# Patient Record
Sex: Female | Born: 1940 | Race: White | Hispanic: No | Smoking: Former smoker
Health system: Southern US, Community
[De-identification: ages and names within clinical notes are randomized; demographics above are authoritative.]

## PROBLEM LIST (undated history)

## (undated) DIAGNOSIS — C801 Malignant (primary) neoplasm, unspecified: Secondary | ICD-10-CM

## (undated) DIAGNOSIS — Z923 Personal history of irradiation: Secondary | ICD-10-CM

## (undated) DIAGNOSIS — D490 Neoplasm of unspecified behavior of digestive system: Secondary | ICD-10-CM

## (undated) DIAGNOSIS — C50919 Malignant neoplasm of unspecified site of unspecified female breast: Secondary | ICD-10-CM

## (undated) HISTORY — PX: APPENDECTOMY: SHX54

## (undated) HISTORY — PX: SPLENECTOMY, TOTAL: SHX788

## (undated) HISTORY — PX: BREAST LUMPECTOMY: SHX2

---

## 2021-06-08 ENCOUNTER — Encounter (HOSPITAL_COMMUNITY): Payer: Self-pay

## 2021-06-08 ENCOUNTER — Other Ambulatory Visit: Payer: Self-pay

## 2021-06-08 ENCOUNTER — Emergency Department (HOSPITAL_COMMUNITY)
Admission: EM | Admit: 2021-06-08 | Discharge: 2021-06-08 | Disposition: A | Discharge status: Home / Self Care | Payer: Medicare Other | Attending: Emergency Medicine | Admitting: Emergency Medicine

## 2021-06-08 ENCOUNTER — Emergency Department (HOSPITAL_COMMUNITY): Payer: Medicare Other

## 2021-06-08 DIAGNOSIS — Z7982 Long term (current) use of aspirin: Secondary | ICD-10-CM | POA: Insufficient documentation

## 2021-06-08 DIAGNOSIS — Z743 Need for continuous supervision: Secondary | ICD-10-CM | POA: Diagnosis not present

## 2021-06-08 DIAGNOSIS — I6521 Occlusion and stenosis of right carotid artery: Secondary | ICD-10-CM | POA: Insufficient documentation

## 2021-06-08 DIAGNOSIS — Z79899 Other long term (current) drug therapy: Secondary | ICD-10-CM | POA: Insufficient documentation

## 2021-06-08 DIAGNOSIS — G5632 Lesion of radial nerve, left upper limb: Secondary | ICD-10-CM | POA: Diagnosis not present

## 2021-06-08 DIAGNOSIS — Z20822 Contact with and (suspected) exposure to covid-19: Secondary | ICD-10-CM | POA: Insufficient documentation

## 2021-06-08 DIAGNOSIS — R531 Weakness: Secondary | ICD-10-CM | POA: Diagnosis not present

## 2021-06-08 DIAGNOSIS — Z853 Personal history of malignant neoplasm of breast: Secondary | ICD-10-CM | POA: Diagnosis not present

## 2021-06-08 DIAGNOSIS — R29818 Other symptoms and signs involving the nervous system: Secondary | ICD-10-CM | POA: Diagnosis not present

## 2021-06-08 DIAGNOSIS — R6889 Other general symptoms and signs: Secondary | ICD-10-CM | POA: Diagnosis not present

## 2021-06-08 HISTORY — DX: Neoplasm of unspecified behavior of digestive system: D49.0

## 2021-06-08 HISTORY — DX: Malignant (primary) neoplasm, unspecified: C80.1

## 2021-06-08 LAB — CBC
HCT: 38.8 % (ref 36.0–46.0)
Hemoglobin: 13.2 g/dL (ref 12.0–15.0)
MCH: 32.5 pg (ref 26.0–34.0)
MCHC: 34 g/dL (ref 30.0–36.0)
MCV: 95.6 fL (ref 80.0–100.0)
Platelets: 300 10*3/uL (ref 150–400)
RBC: 4.06 MIL/uL (ref 3.87–5.11)
RDW: 14.5 % (ref 11.5–15.5)
WBC: 8.2 10*3/uL (ref 4.0–10.5)
nRBC: 0 % (ref 0.0–0.2)

## 2021-06-08 LAB — COMPREHENSIVE METABOLIC PANEL
ALT: 21 U/L (ref 0–44)
AST: 26 U/L (ref 15–41)
Albumin: 3.6 g/dL (ref 3.5–5.0)
Alkaline Phosphatase: 64 U/L (ref 38–126)
Anion gap: 8 (ref 5–15)
BUN: 20 mg/dL (ref 8–23)
CO2: 23 mmol/L (ref 22–32)
Calcium: 9.1 mg/dL (ref 8.9–10.3)
Chloride: 108 mmol/L (ref 98–111)
Creatinine, Ser: 1.14 mg/dL — ABNORMAL HIGH (ref 0.44–1.00)
GFR, Estimated: 48 mL/min — ABNORMAL LOW (ref >60)
Glucose, Bld: 151 mg/dL — ABNORMAL HIGH (ref 70–99)
Potassium: 3.9 mmol/L (ref 3.5–5.1)
Sodium: 139 mmol/L (ref 135–145)
Total Bilirubin: 0.3 mg/dL (ref 0.3–1.2)
Total Protein: 6.4 g/dL — ABNORMAL LOW (ref 6.5–8.1)

## 2021-06-08 LAB — URINALYSIS, ROUTINE W REFLEX MICROSCOPIC
Bilirubin Urine: NEGATIVE
Glucose, UA: NEGATIVE mg/dL
Hgb urine dipstick: NEGATIVE
Ketones, ur: NEGATIVE mg/dL
Leukocytes,Ua: NEGATIVE
Nitrite: NEGATIVE
Protein, ur: NEGATIVE mg/dL
Specific Gravity, Urine: 1.009 (ref 1.005–1.030)
pH: 7 (ref 5.0–8.0)

## 2021-06-08 LAB — DIFFERENTIAL
Abs Immature Granulocytes: 0.01 10*3/uL (ref 0.00–0.07)
Basophils Absolute: 0.1 10*3/uL (ref 0.0–0.1)
Basophils Relative: 1 %
Eosinophils Absolute: 0.5 10*3/uL (ref 0.0–0.5)
Eosinophils Relative: 6 %
Immature Granulocytes: 0 %
Lymphocytes Relative: 35 %
Lymphs Abs: 2.8 10*3/uL (ref 0.7–4.0)
Monocytes Absolute: 1.1 10*3/uL — ABNORMAL HIGH (ref 0.1–1.0)
Monocytes Relative: 14 %
Neutro Abs: 3.6 10*3/uL (ref 1.7–7.7)
Neutrophils Relative %: 44 %

## 2021-06-08 LAB — I-STAT CHEM 8, ED
BUN: 23 mg/dL (ref 8–23)
Calcium, Ion: 1.1 mmol/L — ABNORMAL LOW (ref 1.15–1.40)
Chloride: 108 mmol/L (ref 98–111)
Creatinine, Ser: 1.1 mg/dL — ABNORMAL HIGH (ref 0.44–1.00)
Glucose, Bld: 116 mg/dL — ABNORMAL HIGH (ref 70–99)
HCT: 37 % (ref 36.0–46.0)
Hemoglobin: 12.6 g/dL (ref 12.0–15.0)
Potassium: 3.8 mmol/L (ref 3.5–5.1)
Sodium: 140 mmol/L (ref 135–145)
TCO2: 22 mmol/L (ref 22–32)

## 2021-06-08 LAB — APTT: aPTT: 28 seconds (ref 24–36)

## 2021-06-08 LAB — RAPID URINE DRUG SCREEN, HOSP PERFORMED
Amphetamines: NOT DETECTED
Barbiturates: NOT DETECTED
Benzodiazepines: NOT DETECTED
Cocaine: NOT DETECTED
Opiates: NOT DETECTED
Tetrahydrocannabinol: NOT DETECTED

## 2021-06-08 LAB — RESP PANEL BY RT-PCR (FLU A&B, COVID) ARPGX2
Influenza A by PCR: NEGATIVE
Influenza B by PCR: NEGATIVE
SARS Coronavirus 2 by RT PCR: NEGATIVE

## 2021-06-08 LAB — PROTIME-INR
INR: 0.9 (ref 0.8–1.2)
Prothrombin Time: 12.5 seconds (ref 11.4–15.2)

## 2021-06-08 IMAGING — MR MR HEAD W/O CM
9 of 10 series · 38 of 48 positions shown · non-contrast
Comparison: None.

CLINICAL DATA: Neuro deficit, stroke suspected

EXAM:
MRI HEAD WITHOUT CONTRAST
TECHNIQUE: Multiplanar, multiecho pulse sequences of the brain and surrounding
structures were obtained without intravenous contrast.

[Series 3: DWI · axial · 3.0mm · 1.09mm/px · z∈[-76,+89]mm · 11 of 111 slices shown (1 of 4)]
[im 1/111]
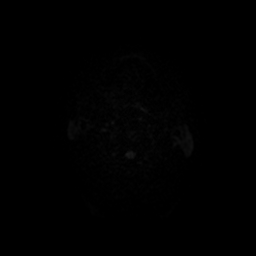
[im 12/111]
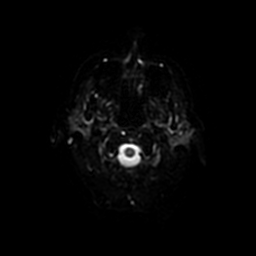
[im 23/111]
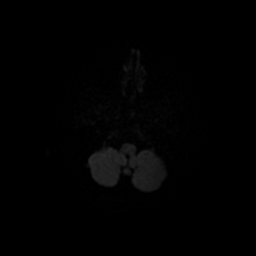
[im 34/111]
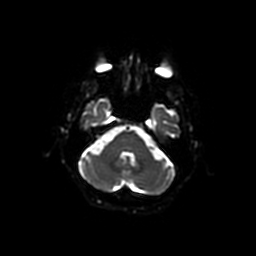
[im 45/111]
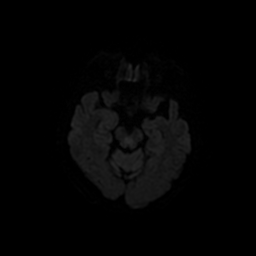
[im 56/111]
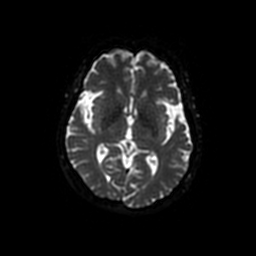
[im 67/111]
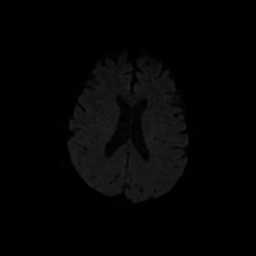
[im 78/111]
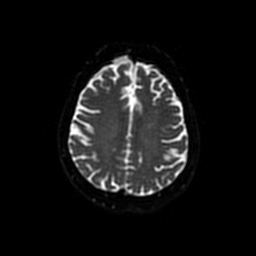
[im 89/111]
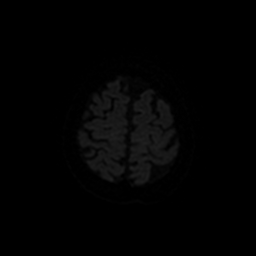
[im 100/111]
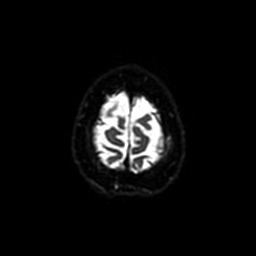
[im 111/111]
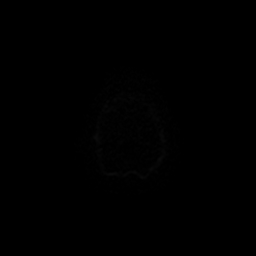

[Series 4: DWI · coronal · 5.0mm · 1.09mm/px · 8 of 78 slices shown (2 of 4)]
[im 1/78]
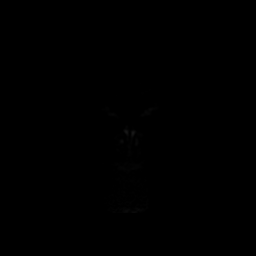
[im 12/78]
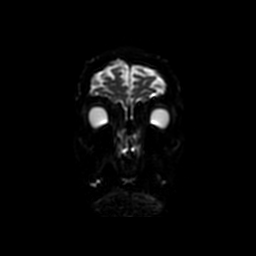
[im 23/78]
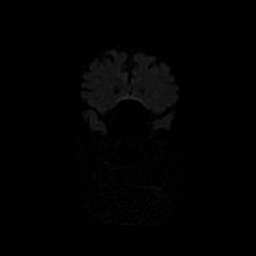
[im 34/78]
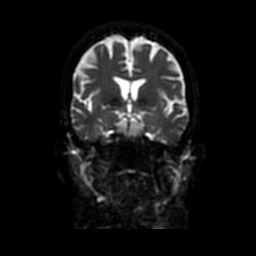
[im 45/78]
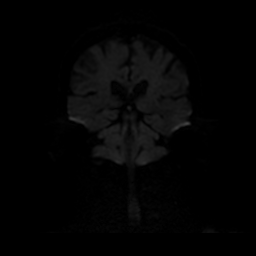
[im 56/78]
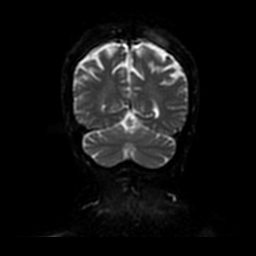
[im 67/78]
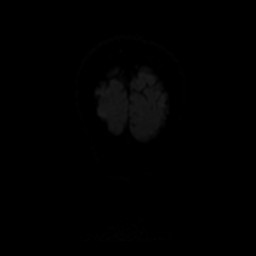
[im 78/78]
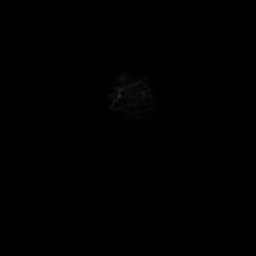

[Series 5: T1 · sagittal · 5.0mm · 0.47mm/px · 2 of 23 slices shown (1 of 2)]
[im 1/23]
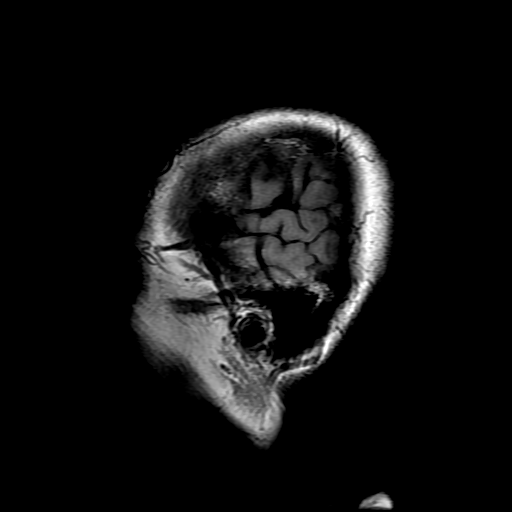
[im 23/23]
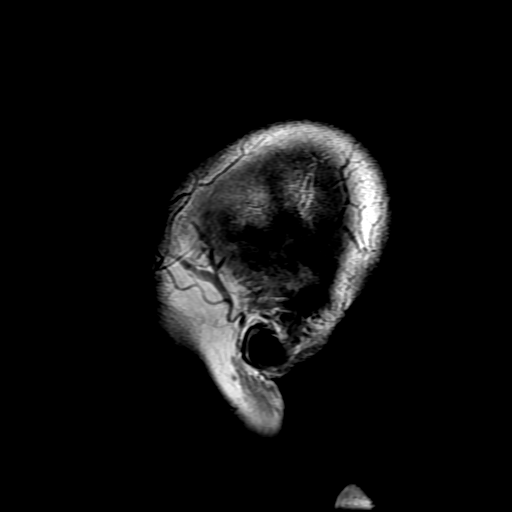

[Series 6: T2 · axial · 5.0mm · 0.43mm/px · z∈[-78,+72]mm · 2 of 26 slices shown (1 of 2)]
[im 1/26]
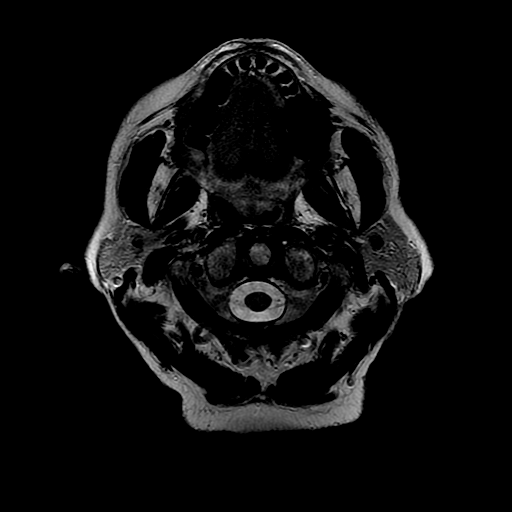
[im 26/26]
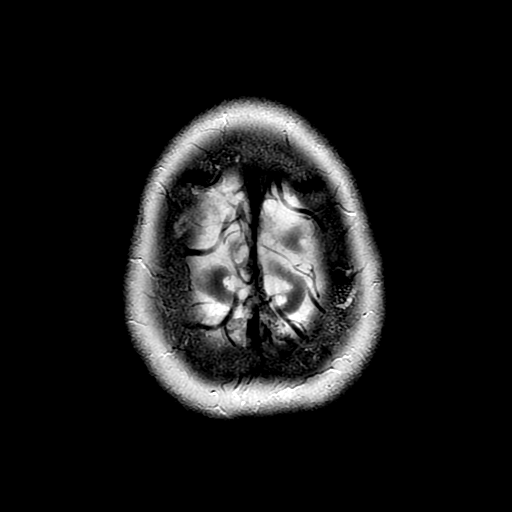

[Series 7: FLAIR · axial · 3.0mm · 0.43mm/px · z∈[-78,+72]mm · 2 of 26 slices shown]
[im 1/26]
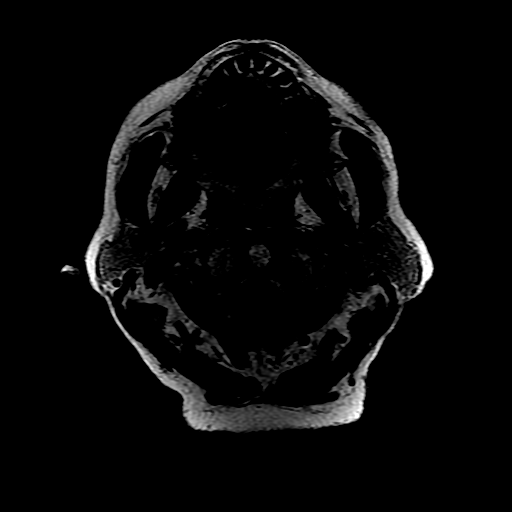
[im 26/26]
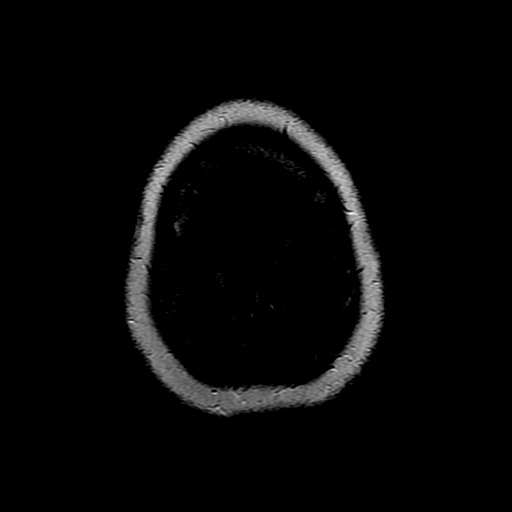

[Series 9: T1 · axial · 3.0mm · 0.43mm/px · z∈[-80,-63]mm · 2 of 104 slices shown (2 of 2)]
[im 1/104]
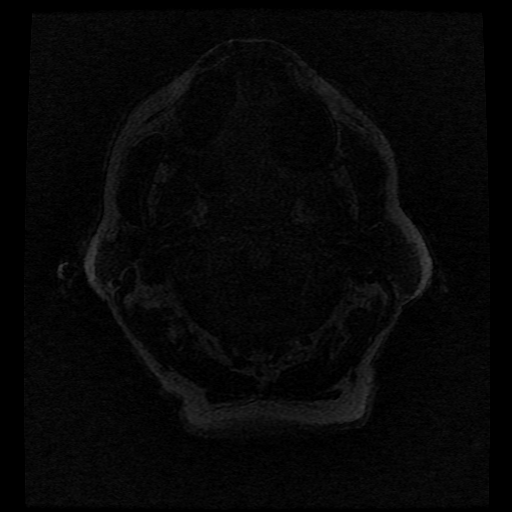
[im 12/104]
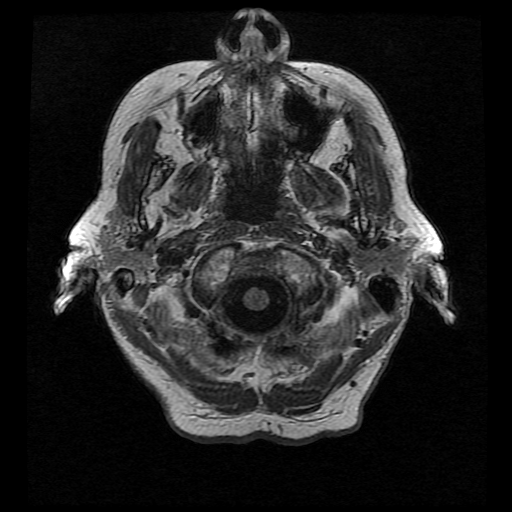

[Series 10: T2 · coronal · 5.0mm · 0.43mm/px · 2 of 24 slices shown (2 of 2)]
[im 1/24]
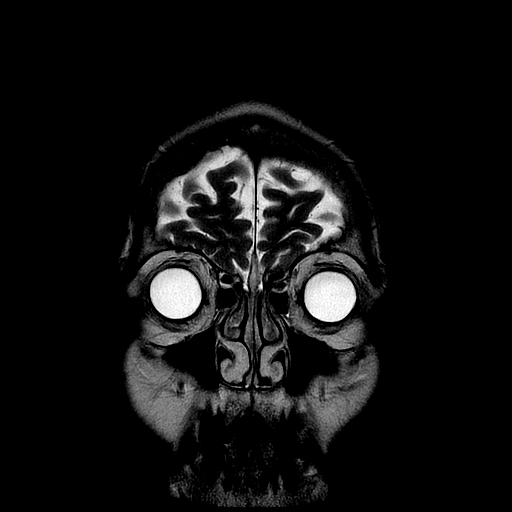
[im 24/24]
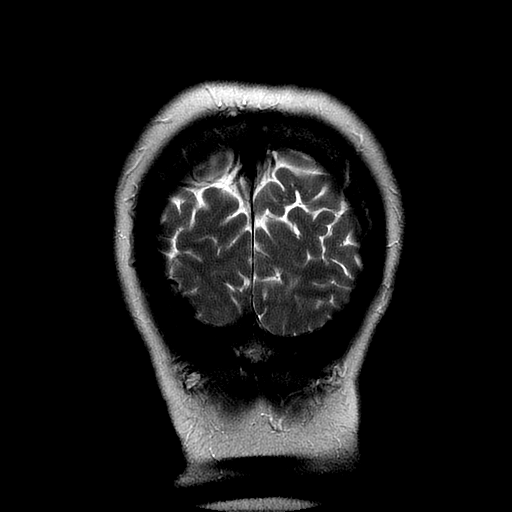

[Series 300: DWI · axial · 3.0mm · 1.09mm/px · z∈[-76,+89]mm · 5 of 56 slices shown (3 of 4)]
[im 1/56]
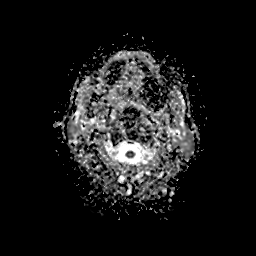
[im 14/56]
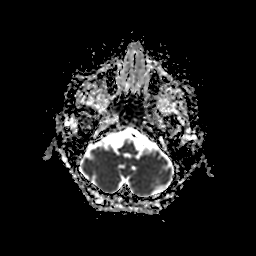
[im 28/56]
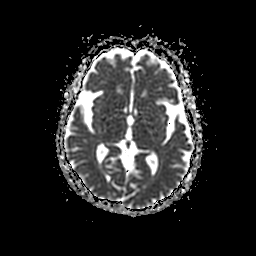
[im 42/56]
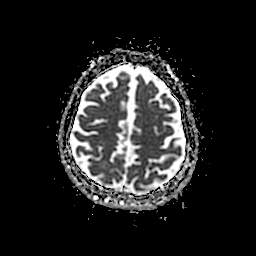
[im 56/56]
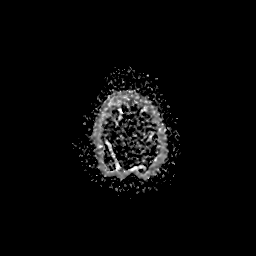

[Series 400: DWI · coronal · 5.0mm · 1.09mm/px · 4 of 38 slices shown (4 of 4)]
[im 1/38]
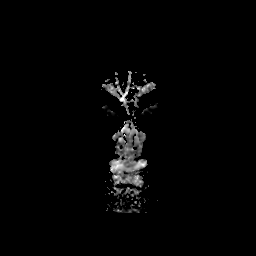
[im 13/38]
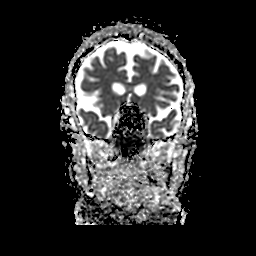
[im 25/38]
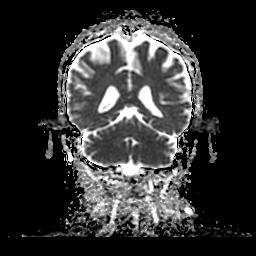
[im 38/38]
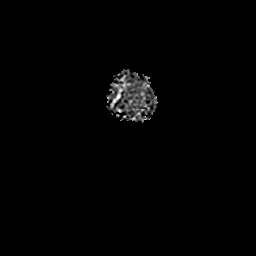

[38 of 48 positions shown; findings below may reference images not displayed]

FINDINGS: Brain: No restricted diffusion to suggest acute infarct. No acute
hemorrhage, mass, mass effect, or midline shift. Scattered T2
hyperintense signal in the periventricular white matter, likely the
sequela of minimal chronic small vessel ischemic disease.

Vascular: Absence of the flow void in the right intracranial
internal carotid artery. Other flow voids are patent.

Skull and upper cervical spine: Normal marrow signal. Degenerative
changes in the cervical spine with trace anterolisthesis of C2 on C3
and C3 on C4, with likely mild spinal canal stenosis at C3-C4.

Sinuses/Orbits: Negative.  Status post bilateral lens replacements.

Other: Trace fluid in right mastoid air cells.
IMPRESSION: 1. Absence of the right intracranial ICA flow void, concerning for
occlusion or slow flow. A CTA of the head and neck is recommended
for further evaluation.
2. No acute infarction.

These results were called by telephone at the time of interpretation
on [DATE] at [DATE] to provider Dr. AZOR , who verbally
acknowledged these results.

## 2021-06-08 MED ORDER — SODIUM CHLORIDE 0.9 % IV BOLUS
500.0000 mL | Freq: Once | INTRAVENOUS | Status: AC
  Administered 2021-06-08: 500 mL via INTRAVENOUS

## 2021-06-08 MED ORDER — SODIUM CHLORIDE 0.9 % IV SOLN: 100.0000 mL/h | INTRAVENOUS | Status: DC

## 2021-06-08 NOTE — ED Provider Notes (Signed)
Clinical Course as of 06/08/21 1811  Thu Jun 08, 2021  1655 80 yo female here with left hand weakness.  Pending Mri to evaluate for infarct.  Ddx also includes peripheral neuropathy.   [MT]  71 MR Brain Wo Contrast (neuro protocol) [MT]  0131 Radiologist reporting no acute CVA but concern for critical stenosis of right ICA vs low-flow state; with no prior imaging available.  Recommending CTA head and neck. [MT]  17 Pt reports she was told she has "complete stenosis of the right ICA" in [place] last month, when she as hospitalized for a transient "stroke"in her right eye.  She had an ultrasound done of her carotid.  She states "I was told there's nothing to be done about it, and they started me on 81 mg aspirin and a statin." [MT]  1800 I spoke to Dr Rory Percy.  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  Likely peripheral compression.  We'll place in velcro wrist splint and have her f/u with neurology.  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  Patient agreeable with plan, okay for discharge [MT]    Clinical Course User Index [MT] Nachmen Mansel, Carola Rhine, MD      Wyvonnia Dusky, MD 06/08/21 [phone]

## 2021-06-08 NOTE — ED Triage Notes (Signed)
Pt BIB [facility] from an urgent care. Pt states at 0500 she noticed her left hand was not working. So pt went to urgent care, urgent care would not see pt and called EMS stating it was a stroke. Pt is alert and oriented x4. No other deficits noted.

## 2021-06-08 NOTE — ED Provider Notes (Signed)
[facility] EMERGENCY DEPARTMENT Provider Note   CSN: [id number] Arrival date & time: 06/08/21  1410     History Chief Complaint  Patient presents with   left hand problem    Nancy Duncan is a 80 y.o. female.  HPI Patient presents with left hand weakness.  Onset was 9 hours prior to my evaluation.  She notes that she woke up, believes that she was briefly in her usual state of health, but soon thereafter noticed inability to use her hand.  There is both weakness and discoordination.  Changes are only in her left hand.  She has a notable history of amaurosis fugax, evaluation at a hospital in [place] earlier this year.  She was started on aspirin, is not seemingly taking dual antiplatelet therapy. She notes that during that evaluation she was diagnosed with carotid occlusion, right-sided. She has recently moved to this area, has no physicians locally.  Since onset earlier today no alleviating, exacerbating factors.    Past Medical History:  Diagnosis Date   Cancer [facility])    Breast Cancer   Pancreatic tumor     There are no problems to display for this patient.   History reviewed. No pertinent surgical history.   OB History   No obstetric history on file.     History reviewed. No pertinent family history.     Home Medications Prior to Admission medications   Not on File    Allergies    Erythromycin  Review of Systems   Review of Systems  Constitutional:        Per HPI, otherwise negative  HENT:         Per HPI, otherwise negative  Respiratory:         Per HPI, otherwise negative  Cardiovascular:        Per HPI, otherwise negative  Gastrointestinal:  Negative for vomiting.  Endocrine:       Negative aside from HPI  Genitourinary:        Neg aside from HPI   Musculoskeletal:        Per HPI, otherwise negative  Skin: Negative.   Neurological:  Positive for weakness. Negative for syncope.   Physical Exam Updated Vital Signs BP (!)  147/73 (BP Location: Right Arm)   Pulse 94   Temp 98.2 F (36.8 C) (Oral)   Resp 20   SpO2 100%   Physical Exam Vitals and nursing note reviewed.  Constitutional:      General: She is not in acute distress.    Appearance: She is well-developed.  HENT:     Head: Normocephalic and atraumatic.  Eyes:     Conjunctiva/sclera: Conjunctivae normal.  Cardiovascular:     Rate and Rhythm: Normal rate and regular rhythm.  Pulmonary:     Effort: Pulmonary effort is normal. No respiratory distress.     Breath sounds: Normal breath sounds. No stridor.  Abdominal:     General: There is no distension.  Skin:    General: Skin is warm and dry.  Neurological:     Mental Status: She is alert and oriented to person, place, and time.     Cranial Nerves: No cranial nerve deficit.     Comments: Neurologic exam otherwise unremarkable beyond left hand which has both extensor tendon dysfunction distally and discoordination.  Patient has appropriate wrist flexion, as above, extension is minimal.  Right side unremarkable, proximal left upper extremity unremarkable    ED Results / Procedures / Treatments  Labs (all labs ordered are listed, but only abnormal results are displayed) Labs Reviewed  RESP PANEL BY RT-PCR (FLU A&B, COVID) ARPGX2  PROTIME-INR  APTT  CBC  DIFFERENTIAL  COMPREHENSIVE METABOLIC PANEL  RAPID URINE DRUG SCREEN, HOSP PERFORMED  URINALYSIS, ROUTINE W REFLEX MICROSCOPIC  I-STAT CHEM 8, ED    EKG EKG Interpretation  Date/Time:  Thursday June 08 2021 14:13:56 EDT Ventricular Rate:  85 PR Interval:  197 QRS Duration: 88 QT Interval:  379 QTC Calculation: 451 R Axis:   66 Text Interpretation: Sinus rhythm Baseline wander Otherwise within normal limits Confirmed by Carmin Muskrat [phone]) on 06/08/2021 2:35:59 PM  Radiology No results found.  Procedures Procedures   Medications Ordered in ED Medications  sodium chloride 0.9 % bolus 500 mL (has no administration in  time range)    Followed by  0.9 %  sodium chloride infusion (has no administration in time range)    ED Course  I have reviewed the triage vital signs and the nursing notes.  Pertinent labs & imaging results that were available during my care of the patient were reviewed by me and considered in my medical decision making (see chart for details).  Cardiac 80s sinus normal Pulse ox 100% room air normal   4:20 PM Initial labs generally unremarkable.  MRI pending. I discussed the patient's case with our neurology colleague. With consideration of hand knob center lesion versus peripheral neuropathy, MRI has been ordered.  If this test is unremarkable, the patient may be appropriate for discharge with outpatient neurology follow-up in the clinic. With otherwise reassuring findings, low suspicion for concurrent new phenomena, such as infection, no evidence of bacteremia, sepsis or other vascular occlusion.  MDM Rules/Calculators/A&P MDM Number of Diagnoses or Management Options Upper extremity weakness: new, needed workup   Amount and/or Complexity of Data Reviewed Clinical lab tests: ordered and reviewed Tests in the radiology section of CPT: ordered Tests in the medicine section of CPT: reviewed and ordered Decide to obtain previous medical records or to obtain history from someone other than the patient: yes Review and summarize past medical records: yes Discuss the patient with other providers: yes Independent visualization of images, tracings, or specimens: yes  Risk of Complications, Morbidity, and/or Mortality Presenting problems: high Diagnostic procedures: high Management options: high  Critical Care Total time providing critical care: < 30 minutes  Patient Progress Patient progress: stable   Final Clinical Impression(s) / ED Diagnoses Final diagnoses:  Upper extremity weakness    Rx / DC Orders ED Discharge Orders     None        Carmin Muskrat,  MD 06/08/21 1621

## 2021-06-08 NOTE — Progress Notes (Signed)
Orthopedic Tech Progress Note Patient Details:  Phyliss Hulick 04-27-1941 [id number]  Ortho Devices Type of Ortho Device: Velcro wrist splint Ortho Device/Splint Location: lue Ortho Device/Splint Interventions: Ordered, Application, Adjustment   Post Interventions Patient Tolerated: Well Instructions Provided: Care of device, Poper ambulation with device  Krithik Mapel L Annalea Alguire 06/08/2021, 6:51 PM

## 2021-06-15 ENCOUNTER — Ambulatory Visit: Payer: Medicare Other | Admitting: Neurology

## 2021-06-15 ENCOUNTER — Encounter: Payer: Self-pay | Admitting: Neurology

## 2021-06-15 ENCOUNTER — Telehealth: Payer: Self-pay | Admitting: Neurology

## 2021-06-15 VITALS — BP 132/76 | HR 82 | Ht 65.0 in | Wt 180.0 lb

## 2021-06-15 DIAGNOSIS — I6521 Occlusion and stenosis of right carotid artery: Secondary | ICD-10-CM | POA: Diagnosis not present

## 2021-06-15 DIAGNOSIS — G459 Transient cerebral ischemic attack, unspecified: Secondary | ICD-10-CM | POA: Diagnosis not present

## 2021-06-15 DIAGNOSIS — Z0000 Encounter for general adult medical examination without abnormal findings: Secondary | ICD-10-CM | POA: Insufficient documentation

## 2021-06-15 DIAGNOSIS — R29898 Other symptoms and signs involving the musculoskeletal system: Secondary | ICD-10-CM | POA: Diagnosis not present

## 2021-06-15 NOTE — Progress Notes (Signed)
[facility]  PATIENT: Quanesha Klimaszewski DOB: 1941/05/10  REFERRING DOCTOR OR PCP: Octaviano Glow MD  SOURCE: Patient, notes from primary care  _________________________________   HISTORICAL  CHIEF COMPLAINT:  Chief Complaint  Patient presents with   New Patient (Initial Visit)    Rm 2 here for consult on possible radial nerve palsy. Pt reports left handed weakness was noted a few weeks ago while hanging a picture up.    HISTORY OF PRESENT ILLNESS:  I had the pleasure of seeing your patient, Jackelyn Gerald Stabs) Winnifred Friar, at [facility] for neurologic consultation regarding her left arm weakness  She is an 80 year old woman who had the onset of right wrist and finger weakness that started 06/08/2021.   She had been more active the previous week as she was moving a lot of boxes with a move into a new place.    SHe tried to hang a picture and noticed the weakness.   Finger extension is completely weak and wrist extension is > 80% weak.     She also has weakness in intrinsic hand muscles but strong finger flexrion and upper arm strength.    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  She had amaurosis fugax on the right leading to additional tests.     Lipitor was prescribed but she stopped and we discussed her getting back on.     She was concerned about a stroke as she has right ICA occlusion and presented to the ED.   While there an MRI of the brain was  She has just been diagnosed with a pancreatic pre-cancer and is being referred to [place] recent PET scan in [place] by her report looked good and will be repeated in 6 months     She denies neck pain.  No urinary urgency or changes.    She denies numbness.    No neck, arm or axillary.      I personally reviewed the MRI of the brain from 06/08/2021.  It shows minimal age appropriate generalized cortical atrophy and mild chronic microvascular ischemic changes in the pons and hemispheres.  The  right internal carotid artery is occluded.  REVIEW OF SYSTEMS: Constitutional: No fevers, chills, sweats, or change in appetite Eyes: No visual changes, double vision, eye pain Ear, nose and throat: No hearing loss, ear pain, nasal congestion, sore throat Cardiovascular: No chest pain, palpitations Respiratory:  No shortness of breath at rest or with exertion.   No wheezes GastrointestinaI: No nausea, vomiting, diarrhea, abdominal pain, fecal incontinence Genitourinary:  No dysuria, urinary retention or frequency.  No nocturia. Musculoskeletal:  No neck pain, back pain Integumentary: No rash, pruritus, skin lesions Neurological: as above Psychiatric: No depression at this time.  No anxiety Endocrine: No palpitations, diaphoresis, change in appetite, change in weigh or increased thirst Hematologic/Lymphatic:  No anemia, purpura, petechiae. Allergic/Immunologic: No itchy/runny eyes, nasal congestion, recent allergic reactions, rashes  ALLERGIES: Allergies  Allergen Reactions   Erythromycin Swelling    HOME MEDICATIONS:  Current Outpatient Medications:    alendronate (FOSAMAX) 70 MG tablet, Take 70 mg by mouth once a week., Disp: , Rfl:    ASPIRIN 81 PO, Take by mouth., Disp: , Rfl:    citalopram (CELEXA) 10 MG tablet, Take 10 mg by mouth daily., Disp: , Rfl:   PAST MEDICAL HISTORY: Past Medical History:  Diagnosis Date   Cancer [facility])    Breast Cancer   Pancreatic tumor  PAST SURGICAL HISTORY: Past Surgical History:  Procedure Laterality Date   APPENDECTOMY     SPLENECTOMY, TOTAL      FAMILY HISTORY: Family History  Problem Relation Age of Onset   Cancer - Ovarian Mother    Arthritis Father    Alzheimer's disease Sister    Arthritis Sister     SOCIAL HISTORY:  Social History   Socioeconomic History   Marital status: Widowed    Spouse name: Not on file   Number of children: 3   Years of education: Not on file   Highest education level: Master's degree  (e.g., MA, MS, MEng, MEd, MSW, MBA)  Occupational History   Not on file  Tobacco Use   Smoking status: Former    Types: Cigarettes   Smokeless tobacco: Never  Substance and Sexual Activity   Alcohol use: Yes    Comment: rare   Drug use: Never   Sexual activity: Not on file  Other Topics Concern   Not on file  Social History Narrative   Right handed    Caffeine  cup per day   Lives at home alone    Social Determinants of Health   Financial Resource Strain: Not on file  Food Insecurity: Not on file  Transportation Needs: Not on file  Physical Activity: Not on file  Stress: Not on file  Social Connections: Not on file  Intimate Partner Violence: Not on file     PHYSICAL EXAM  Vitals:   06/15/21 1037  BP: 132/76  Pulse: 82  SpO2: 98%  Weight: 180 lb (81.6 kg)  Height: 5\' 5"  (1.651 m)    Body mass index is 29.95 kg/m.   General: The patient is well-developed and well-nourished and in no acute distress  HEENT:  Head is [place]/AT.  Sclera are anicteric.     Neck: No carotid bruits are noted.  The neck is nontender.  Cardiovascular: The heart has a regular rate and rhythm with a normal S1 and S2. There were no murmurs, gallops or rubs.    Skin: Extremities are without rash or  edema.  Musculoskeletal:  Back is nontender  Neurologic Exam  Mental status: The patient is alert and oriented x 3 at the time of the examination. The patient has apparent normal recent and remote memory, with an apparently normal attention span and concentration ability.   Speech is normal.  Cranial nerves: Extraocular movements are full. Pupils are equal, round, and reactive to light and accomodation.  Visual fields are full.  Facial symmetry is present. There is good facial sensation to soft touch bilaterally.Facial strength is normal.  Trapezius and sternocleidomastoid strength is normal. No dysarthria is noted.  The tongue is midline, and the patient has symmetric elevation of the soft  palate. No obvious hearing deficits are noted.  Motor:  Muscle bulk is normal.   Tone is normal. Strength is  5 / 5 in the right hand/arm and both legs.  Strength was 5/5 in all proximal muscles of the left arm and in the finger flexors, wrist flexors and pronators.  Strength was 4/5 in the wrist extensors but only to minus/5 in finger extensors and 2-3/5 in ulnar  and median innervated hand muscles  Sensory: Sensory testing is intact to pinprick, soft touch and vibration sensation in all 4 extremities.  Coordination: Cerebellar testing reveals good finger-nose-finger and heel-to-shin bilaterally.  Gait and station: Station is normal.   Gait is normal. Tandem gait is normal. Romberg is negative.  Reflexes: Deep tendon reflexes are symmetric and normal bilaterally.   Plantar responses are flexor.    DIAGNOSTIC DATA (LABS, IMAGING, TESTING) - I reviewed patient records, labs, notes, testing and imaging myself where available.  Lab Results  Component Value Date   WBC 8.2 06/08/2021   HGB 12.6 06/08/2021   HCT 37.0 06/08/2021   MCV 95.6 06/08/2021   PLT 300 06/08/2021      Component Value Date/Time   NA 140 06/08/2021 1544   K 3.8 06/08/2021 1544   CL 108 06/08/2021 1544   CO2 23 06/08/2021 1435   GLUCOSE 116 (H) 06/08/2021 1544   BUN 23 06/08/2021 1544   CREATININE 1.10 (H) 06/08/2021 1544   CALCIUM 9.1 06/08/2021 1435   PROT 6.4 (L) 06/08/2021 1435   ALBUMIN 3.6 06/08/2021 1435   AST 26 06/08/2021 1435   ALT 21 06/08/2021 1435   ALKPHOS 64 06/08/2021 1435   BILITOT 0.3 06/08/2021 1435   GFRNONAA 48 (L) 06/08/2021 1435        ASSESSMENT AND PLAN  Left arm weakness - Plan: MR CERVICAL SPINE W WO CONTRAST, MR HUMERUS LEFT W WO CONTRAST, NCV with EMG(electromyography), CANCELED: NCV with EMG(electromyography)  Well adult health check - Plan: Ambulatory referral to Internal Medicine  In summary, Ms. Joaquin Courts is an 80 year old woman who had the onset of left arm weakness  last week.  With known right internal carotid occlusion and TIA in August 2022 she was rightly concerned about the possibility of stroke.  However, MRI did not show any acute findings and was essentially normal for age.  On examination today, she has a left wrist drop but also has weakness in the ulnar and median innervated hand muscles.  Therefore, a radial nerve palsy cannot explain her findings.  I am most concerned about the possibility of a brachial plexopathy, myelopathy or C8 radiculopathy.  Of note, she does have a history of possible pancreatic cancer and will be seen at [facility] and had a history of breast cancer (about 40 years ago) we will check MRI of the cervical spine and brachial plexus.  She does not have pain which would be atypical for radiculopathy and therefore we will also check an NCV/EMG study.  She has recently moved to this area and asked if I could refer her to internal medicine as well.  She will return to see me when she comes in for the NCV/EMG study and we will let her know the results of the imaging studies at that time or earlier.  Thank you for asking me to see Ms. Nilson.  Please let me know if I can be of further assistance with her or other patients in the future.  Freja Faro A. Felecia Shelling, MD, [facility] 50/56/9794, 80:16 AM Certified in Neurology, Clinical Neurophysiology, Sleep Medicine and Neuroimaging  [facility] [address], [place], [place] [address] [phone]

## 2021-06-15 NOTE — Telephone Encounter (Signed)
[employer] medicare order sent to [facility], [facility] they will reach out to the patient to schedule.  

## 2021-06-24 ENCOUNTER — Encounter: Payer: Self-pay | Admitting: Neurology

## 2021-07-08 ENCOUNTER — Ambulatory Visit
Admission: RE | Admit: 2021-07-08 | Discharge: 2021-07-08 | Disposition: A | Discharge status: Home / Self Care | Payer: Medicare Other | Source: Ambulatory Visit | Attending: Neurology | Admitting: Neurology

## 2021-07-08 ENCOUNTER — Other Ambulatory Visit: Payer: Self-pay

## 2021-07-08 ENCOUNTER — Other Ambulatory Visit: Payer: Self-pay | Admitting: Neurology

## 2021-07-08 DIAGNOSIS — R531 Weakness: Secondary | ICD-10-CM | POA: Diagnosis not present

## 2021-07-08 DIAGNOSIS — M75122 Complete rotator cuff tear or rupture of left shoulder, not specified as traumatic: Secondary | ICD-10-CM | POA: Diagnosis not present

## 2021-07-08 DIAGNOSIS — R29898 Other symptoms and signs involving the musculoskeletal system: Secondary | ICD-10-CM

## 2021-07-08 IMAGING — MR MR CHEST MEDIASTINUM WO/W CM
10 series · 16 of 16 positions shown · IV contrast (multihance)
Comparison: None.

CLINICAL DATA: Left arm weakness for 1 month. History of breast
cancer and pancreatic cancer.

EXAM:
MRI BRACHIAL PLEXUS LEFT WITHOUT AND WITH CONTRAST
TECHNIQUE: Multiplanar, multiecho pulse sequences of the neck and surrounding
structures were obtained without intravenous contrast. The field of
view was focused on the left brachial plexus from the neural
foramina to the axilla.
CONTRAST:  18mL MULTIHANCE GADOBENATE DIMEGLUMINE 529 MG/ML IV SOLN

[Series 2: T1 · axial · 3.0mm · 1.06mm/px · z∈[-155,+6]mm · 3 of 42 slices shown (1 of 3)]
[im 1/42]
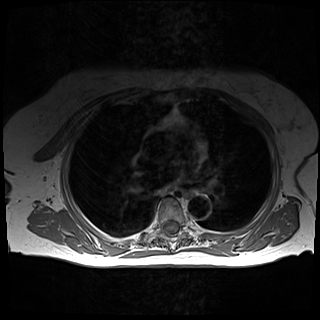
[im 21/42]
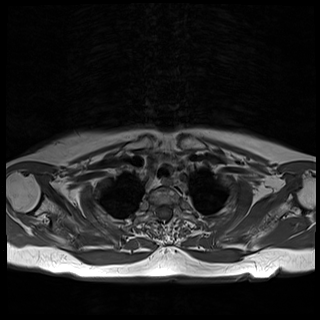
[im 42/42]
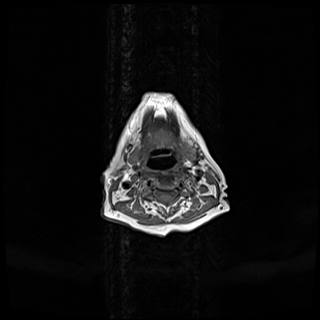

[Series 3: T2 fat-sat · axial · 3.0mm · 1.33mm/px · z∈[-155,+6]mm · 3 of 42 slices shown]
[im 1/42]
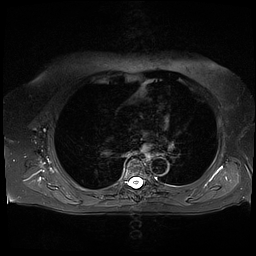
[im 21/42]
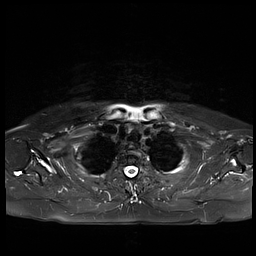
[im 42/42]
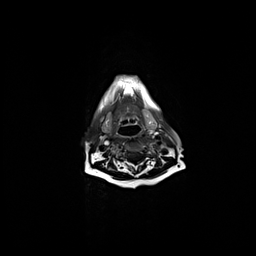

[Series 4: T2 · sagittal · 3.0mm · 0.94mm/px · 1 of 23 slices shown]
[im 1/23]
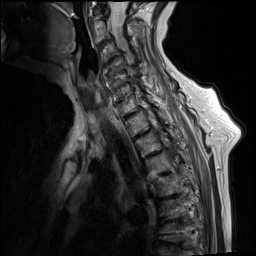

[Series 5: T1 · coronal · 3.0mm · 0.75mm/px · 1 of 23 slices shown (2 of 3)]
[im 1/23]
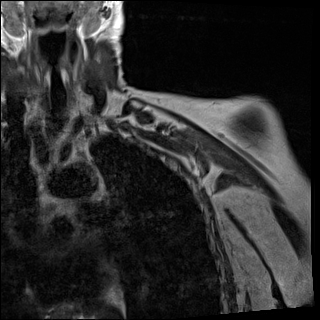

[Series 6: STIR · coronal · 3.0mm · 0.94mm/px · 1 of 23 slices shown]
[im 1/23]
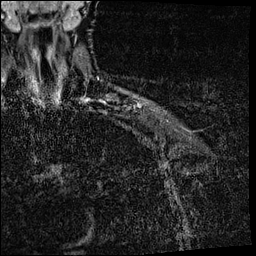

[Series 7: T1 · sagittal · 3.0mm · 0.75mm/px · 1 of 25 slices shown (3 of 3)]
[im 1/25]
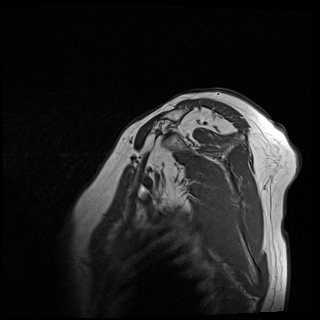

[Series 8: T1 fat-sat · axial · non-contrast · 3.0mm · 1.06mm/px · z∈[-147,+14]mm · 2 of 42 slices shown]
[im 1/42]
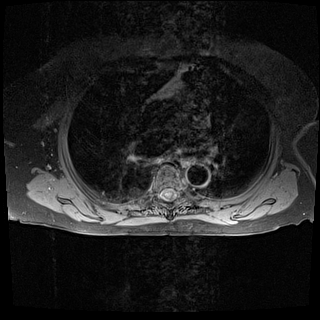
[im 42/42]
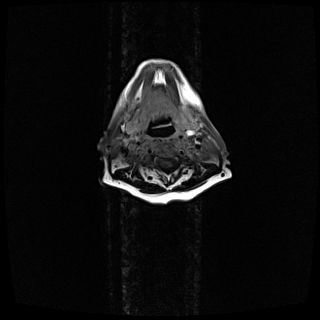

[Series 9: T1 fat-sat post-contrast · axial · 3.0mm · 1.06mm/px · z∈[-147,+14]mm · 2 of 42 slices shown (1 of 3)]
[im 1/42]
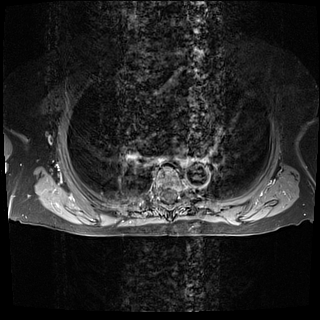
[im 42/42]
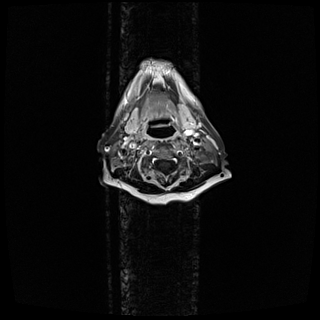

[Series 10: T1 fat-sat post-contrast · sagittal · 3.0mm · 0.75mm/px · 1 of 25 slices shown (2 of 3)]
[im 1/25]
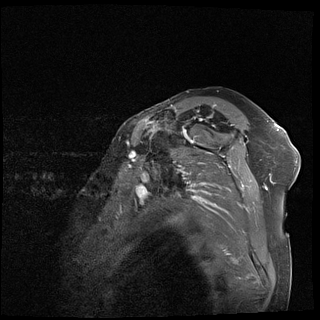

[Series 11: T1 fat-sat post-contrast · coronal · 3.0mm · 0.75mm/px · 1 of 23 slices shown (3 of 3)]
[im 1/23]
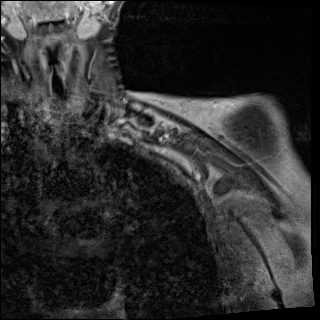

[16 of 16 positions shown; findings below may reference images not displayed]

FINDINGS: Spinal cord

No obvious cord signal abnormality.

Brachial plexus:

Scalene triangle, costoclavicular space, and pectoralis minor space
are within normal limits. No evidence of fibrous band or mass
lesion.

Roots: Unremarkable.

Trunks: Unremarkable.

Divisions: Unremarkable.

Cords: Unremarkable.

Distal brachial plexus/branches: Unremarkable.

Muscles and tendons

Large full-thickness retracted rotator cuff tear which appears to
involve the supraspinatus tendon in the region of the critical zone
with a 1.4 cm tendon gap (series 6, image 12). Full evaluation of
the rotator cuff tendons is limited by field of view and technique.
Musculature of the shoulder girdle and visualized chest wall appear
unremarkable without edema, atrophy, or fatty infiltration to
suggest denervation changes.

Bones

No cervical rib. The left first rib is normal in appearance.
Visualized marrow structures are unremarkable. No fracture or marrow
replacing lesion.

Joints

Sternoclavicular, acromioclavicular, and glenohumeral joints appear
within normal limits. No joint effusion.

Cervical spine

Multilevel degenerative disc disease and facet arthropathy
throughout the cervical spine with disc-osteophyte complexes
resulting in at least moderate canal stenosis at the C4-5, C5-6, and
C6-7 levels (series 4, image 3). There is also at least mild canal
stenosis at C2-3 and C3-4.

Other findings

No mass, adenopathy, or abnormal postcontrast enhancement.
IMPRESSION: 1. Unremarkable appearance of the left brachial plexus.
2. Multilevel cervical spine spondylosis with at least moderate
canal stenosis from the C4-5 through C6-7 levels. This could be
further evaluated with a dedicated cervical spine MRI, as clinically
indicated.
3. Large full-thickness retracted rotator cuff tear, likely
involving the supraspinatus tendon in the region of the critical
zone with a 1.4 cm tendon gap.

## 2021-07-08 MED ORDER — GADOBENATE DIMEGLUMINE 529 MG/ML IV SOLN
18.0000 mL | Freq: Once | INTRAVENOUS | Status: AC | PRN
  Administered 2021-07-08: 18 mL via INTRAVENOUS

## 2021-07-13 ENCOUNTER — Other Ambulatory Visit: Payer: Self-pay | Admitting: *Deleted

## 2021-07-13 ENCOUNTER — Encounter: Payer: Self-pay | Admitting: Neurology

## 2021-07-13 ENCOUNTER — Telehealth: Payer: Self-pay | Admitting: Neurology

## 2021-07-13 DIAGNOSIS — M5412 Radiculopathy, cervical region: Secondary | ICD-10-CM

## 2021-07-13 NOTE — Telephone Encounter (Signed)
[employer] medicare order sent to [facility], [facility] they will reach out to the patient to schedule.  

## 2021-07-14 DIAGNOSIS — K469 Unspecified abdominal hernia without obstruction or gangrene: Secondary | ICD-10-CM | POA: Diagnosis not present

## 2021-07-23 DIAGNOSIS — R6889 Other general symptoms and signs: Secondary | ICD-10-CM | POA: Diagnosis not present

## 2021-07-31 ENCOUNTER — Ambulatory Visit
Admission: RE | Admit: 2021-07-31 | Discharge: 2021-07-31 | Disposition: A | Discharge status: Home / Self Care | Payer: Medicare Other | Source: Ambulatory Visit | Attending: Neurology | Admitting: Neurology

## 2021-07-31 ENCOUNTER — Other Ambulatory Visit: Payer: Self-pay

## 2021-07-31 DIAGNOSIS — M5412 Radiculopathy, cervical region: Secondary | ICD-10-CM | POA: Diagnosis not present

## 2021-08-02 ENCOUNTER — Ambulatory Visit (INDEPENDENT_AMBULATORY_CARE_PROVIDER_SITE_OTHER): Payer: Medicare Other | Admitting: Emergency Medicine

## 2021-08-02 ENCOUNTER — Encounter: Payer: Self-pay | Admitting: Emergency Medicine

## 2021-08-02 ENCOUNTER — Other Ambulatory Visit: Payer: Self-pay

## 2021-08-02 VITALS — BP 138/70 | HR 78 | Temp 97.0°F | Wt 180.0 lb

## 2021-08-02 DIAGNOSIS — D490 Neoplasm of unspecified behavior of digestive system: Secondary | ICD-10-CM | POA: Insufficient documentation

## 2021-08-02 DIAGNOSIS — S93401A Sprain of unspecified ligament of right ankle, initial encounter: Secondary | ICD-10-CM | POA: Diagnosis not present

## 2021-08-02 DIAGNOSIS — I6521 Occlusion and stenosis of right carotid artery: Secondary | ICD-10-CM

## 2021-08-02 DIAGNOSIS — Z8673 Personal history of transient ischemic attack (TIA), and cerebral infarction without residual deficits: Secondary | ICD-10-CM | POA: Insufficient documentation

## 2021-08-02 DIAGNOSIS — Z9081 Acquired absence of spleen: Secondary | ICD-10-CM

## 2021-08-02 DIAGNOSIS — Z7689 Persons encountering health services in other specified circumstances: Secondary | ICD-10-CM

## 2021-08-02 DIAGNOSIS — Z23 Encounter for immunization: Secondary | ICD-10-CM | POA: Diagnosis not present

## 2021-08-02 DIAGNOSIS — K439 Ventral hernia without obstruction or gangrene: Secondary | ICD-10-CM

## 2021-08-02 DIAGNOSIS — S93401S Sprain of unspecified ligament of right ankle, sequela: Secondary | ICD-10-CM

## 2021-08-02 NOTE — Progress Notes (Signed)
Nancy Duncan 80 y.o.   Chief Complaint  Patient presents with   New Patient (Initial Visit)    Manage medication. Also, address ankle swelling    HISTORY OF PRESENT ILLNESS: This is a 80 y.o. female first visit to this office, here to establish care with me. 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  She then had vaccines against H. influenzae, pneumococcus, and meningitis.  Has appointment with [facility] oncology division coming up. 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  Presently on baby aspirin daily. #3 ventral hernia.  Asymptomatic #4 recent right ankle sprain with persistent swelling and minimal pain. #5 history of dyslipidemia.  Takes Lipitor 10 mg every other day due to side effects. #6 history of osteoporosis on Fosamax 70 mg weekly  HPI   Prior to Admission medications   Medication Sig Start Date End Date Taking? Authorizing Provider  alendronate (FOSAMAX) 70 MG tablet Take 70 mg by mouth once a week. 06/05/21  Yes [provider]  ASPIRIN 81 PO Take by mouth.   Yes [provider]  atorvastatin (LIPITOR) 20 MG tablet Take by mouth.   Yes [provider]  citalopram (CELEXA) 10 MG tablet Take 10 mg by mouth daily.   Yes [provider]  traZODone (DESYREL) 100 MG tablet Take 100 mg by mouth at bedtime. 07/04/21  Yes [provider]    Allergies  Allergen Reactions   Erythromycin Swelling    Patient Active Problem List   Diagnosis Date Noted   Occlusion of right internal carotid artery 06/15/2021   TIA (transient ischemic attack) 06/15/2021    Past Medical History:  Diagnosis Date   Cancer [facility])    Breast Cancer   Pancreatic tumor     Past Surgical History:  Procedure Laterality Date   APPENDECTOMY     SPLENECTOMY, TOTAL      Social History   Socioeconomic History   Marital  status: Widowed    Spouse name: Not on file   Number of children: 3   Years of education: Not on file   Highest education level: Master's degree (e.g., MA, MS, MEng, MEd, MSW, MBA)  Occupational History   Not on file  Tobacco Use   Smoking status: Former    Types: Cigarettes   Smokeless tobacco: Never  Substance and Sexual Activity   Alcohol use: Yes    Comment: rare   Drug use: Never   Sexual activity: Not on file  Other Topics Concern   Not on file  Social History Narrative   Right handed    Caffeine  cup per day   Lives at home alone    Social Determinants of Health   Financial Resource Strain: Not on file  Food Insecurity: Not on file  Transportation Needs: Not on file  Physical Activity: Not on file  Stress: Not on file  Social Connections: Not on file  Intimate Partner Violence: Not on file    Family History  Problem Relation Age of Onset   Cancer - Ovarian Mother    Arthritis Father    Alzheimer's disease Sister    Arthritis Sister      Review of Systems  Constitutional: Negative.  Negative for chills and fever.  HENT: Negative.  Negative for congestion and sore throat.   Respiratory: Negative.  Negative for cough and shortness of breath.  Cardiovascular: Negative.  Negative for chest pain and palpitations.  Gastrointestinal:  Negative for abdominal pain, diarrhea, nausea and vomiting.  Genitourinary: Negative.   Skin: Negative.  Negative for rash.  Neurological:  Negative for dizziness and headaches.  All other systems reviewed and are negative. Today's Vitals   08/02/21 1641  BP: 138/70  Pulse: 78  Temp: (!) 97 F (36.1 C)  Weight: 180 lb (81.6 kg)   Body mass index is 29.95 kg/m.   Physical Exam Vitals reviewed.  Constitutional:      Appearance: Normal appearance.  HENT:     Head: Normocephalic.     Right Ear: Tympanic membrane, ear canal and external ear normal.     Left Ear: Tympanic membrane, ear canal and external ear normal.      Mouth/Throat:     Mouth: Mucous membranes are moist.     Pharynx: Oropharynx is clear.  Eyes:     Extraocular Movements: Extraocular movements intact.     Conjunctiva/sclera: Conjunctivae normal.     Pupils: Pupils are equal, round, and reactive to light.  Cardiovascular:     Rate and Rhythm: Normal rate and regular rhythm.     Pulses: Normal pulses.     Heart sounds: Normal heart sounds.  Pulmonary:     Effort: Pulmonary effort is normal.     Breath sounds: Normal breath sounds.  Abdominal:     General: There is no distension.     Palpations: Abdomen is soft.     Tenderness: There is no abdominal tenderness.     Hernia: A hernia (Ventral, no tenderness) is present.  Musculoskeletal:     Cervical back: Normal range of motion and neck supple. No tenderness.     Comments: Right ankle: Positive swelling and mild medial tenderness.  Full range of motion.  No ecchymosis or erythema.  Lymphadenopathy:     Cervical: No cervical adenopathy.  Skin:    General: Skin is warm and dry.     Capillary Refill: Capillary refill takes less than 2 seconds.  Neurological:     General: No focal deficit present.     Mental Status: She is alert and oriented to person, place, and time.  Psychiatric:        Mood and Affect: Mood normal.        Behavior: Behavior normal.     ASSESSMENT & PLAN: A total of 47 minutes was spent with the patient and counseling/coordination of care regarding preparing for this visit, establishing care with me, review of past medical history, review of most recent neurology office visit notes, most recent emergency department visit in early November, review of diagnostic imaging, comprehensive history and physical examination, review of most recent blood work results, review of all medications, ED precautions regarding ventral hernia, prognosis, documentation, need for follow-up  Problem List Items Addressed This Visit       Cardiovascular and Mediastinum   Occlusion of  right internal carotid artery    Stable and asymptomatic.  Continue daily baby aspirin.      Relevant Medications   atorvastatin (LIPITOR) 20 MG tablet     Digestive   Pancreatic tumor    Scheduled to follow-up with oncology department at [facility].        Other   History of TIA (transient ischemic attack)   History of splenectomy    Needs coverage against encapsulated organisms. Needs second dose of pneumonia vaccine.      Ventral hernia without obstruction  or gangrene    Asymptomatic and not affecting quality of life.  Does not desire surgical intervention.      Other Visit Diagnoses     Sprain of right ankle, unspecified ligament, sequela    -  Primary   Need for pneumococcal vaccine       Relevant Orders   Pneumococcal conjugate vaccine 20-valent (Prevnar 20) (Completed)   Encounter to establish care          Patient Instructions  Health Maintenance After Age 5 After age 25, you are at a higher risk for certain long-term diseases and infections as well as injuries from falls. Falls are a major cause of broken bones and head injuries in people who are older than age 73. Getting regular preventive care can help to keep you healthy and well. Preventive care includes getting regular testing and making lifestyle changes as recommended by your health care provider. Talk with your health care provider about: Which screenings and tests you should have. A screening is a test that checks for a disease when you have no symptoms. A diet and exercise plan that is right for you. What should I know about screenings and tests to prevent falls? Screening and testing are the best ways to find a health problem early. Early diagnosis and treatment give you the best chance of managing medical conditions that are common after age 77. Certain conditions and lifestyle choices may make you more likely to have a fall. Your health care provider may recommend: Regular vision  checks. Poor vision and conditions such as cataracts can make you more likely to have a fall. If you wear glasses, make sure to get your prescription updated if your vision changes. Medicine review. Work with your health care provider to regularly review all of the medicines you are taking, including over-the-counter medicines. Ask your health care provider about any side effects that may make you more likely to have a fall. Tell your health care provider if any medicines that you take make you feel dizzy or sleepy. Strength and balance checks. Your health care provider may recommend certain tests to check your strength and balance while standing, walking, or changing positions. Foot health exam. Foot pain and numbness, as well as not wearing proper footwear, can make you more likely to have a fall. Screenings, including: Osteoporosis screening. Osteoporosis is a condition that causes the bones to get weaker and break more easily. Blood pressure screening. Blood pressure changes and medicines to control blood pressure can make you feel dizzy. Depression screening. You may be more likely to have a fall if you have a fear of falling, feel depressed, or feel unable to do activities that you used to do. Alcohol use screening. Using too much alcohol can affect your balance and may make you more likely to have a fall. Follow these instructions at home: Lifestyle Do not drink alcohol if: Your health care provider tells you not to drink. If you drink alcohol: Limit how much you have to: 0-1 drink a day for women. 0-2 drinks a day for men. Know how much alcohol is in your drink. In the [place] one drink equals one 12 oz bottle of beer (355 mL), one 5 oz glass of wine (148 mL), or one 1 oz glass of hard liquor (44 mL). Do not use any products that contain nicotine or tobacco. These products include cigarettes, chewing tobacco, and vaping devices, such as e-cigarettes. If you need help quitting, ask your  health  care provider. Activity  Follow a regular exercise program to stay fit. This will help you maintain your balance. Ask your health care provider what types of exercise are appropriate for you. If you need a cane or walker, use it as recommended by your health care provider. Wear supportive shoes that have nonskid soles. Safety  Remove any tripping hazards, such as rugs, cords, and clutter. Install safety equipment such as grab bars in bathrooms and safety rails on stairs. Keep rooms and walkways well-lit. General instructions Talk with your health care provider about your risks for falling. Tell your health care provider if: You fall. Be sure to tell your health care provider about all falls, even ones that seem minor. You feel dizzy, tiredness (fatigue), or off-balance. Take over-the-counter and prescription medicines only as told by your health care provider. These include supplements. Eat a healthy diet and maintain a healthy weight. A healthy diet includes low-fat dairy products, low-fat (lean) meats, and fiber from whole grains, beans, and lots of fruits and vegetables. Stay current with your vaccines. Schedule regular health, dental, and eye exams. Summary Having a healthy lifestyle and getting preventive care can help to protect your health and wellness after age 17. Screening and testing are the best way to find a health problem early and help you avoid having a fall. Early diagnosis and treatment give you the best chance for managing medical conditions that are more common for people who are older than age 76. Falls are a major cause of broken bones and head injuries in people who are older than age 80. Take precautions to prevent a fall at home. Work with your health care provider to learn what changes you can make to improve your health and wellness and to prevent falls. This information is not intended to replace advice given to you by your health care provider. Make sure  you discuss any questions you have with your health care provider. Document Revised: 12/12/2020 Document Reviewed: 12/12/2020 Elsevier Patient Education  2022 [place], [place] [facility]

## 2021-08-02 NOTE — Assessment & Plan Note (Signed)
Asymptomatic and not affecting quality of life.  Does not desire surgical intervention.

## 2021-08-02 NOTE — Assessment & Plan Note (Signed)
Scheduled to follow-up with oncology department at [facility].

## 2021-08-02 NOTE — Assessment & Plan Note (Signed)
Needs coverage against encapsulated organisms. Needs second dose of pneumonia vaccine.

## 2021-08-02 NOTE — Assessment & Plan Note (Signed)
Stable and asymptomatic.  Continue daily baby aspirin.

## 2021-08-02 NOTE — Patient Instructions (Signed)
Health Maintenance After Age 82 After age 82, you are at a higher risk for certain long-term diseases and infections as well as injuries from falls. Falls are a major cause of broken bones and head injuries in people who are older than age 82. Getting regular preventive care can help to keep you healthy and well. Preventive care includes getting regular testing and making lifestyle changes as recommended by your health care provider. Talk with your health care provider about: Which screenings and tests you should have. A screening is a test that checks for a disease when you have no symptoms. A diet and exercise plan that is right for you. What should I know about screenings and tests to prevent falls? Screening and testing are the best ways to find a health problem early. Early diagnosis and treatment give you the best chance of managing medical conditions that are common after age 82. Certain conditions and lifestyle choices may make you more likely to have a fall. Your health care provider may recommend: Regular vision checks. Poor vision and conditions such as cataracts can make you more likely to have a fall. If you wear glasses, make sure to get your prescription updated if your vision changes. Medicine review. Work with your health care provider to regularly review all of the medicines you are taking, including over-the-counter medicines. Ask your health care provider about any side effects that may make you more likely to have a fall. Tell your health care provider if any medicines that you take make you feel dizzy or sleepy. Strength and balance checks. Your health care provider may recommend certain tests to check your strength and balance while standing, walking, or changing positions. Foot health exam. Foot pain and numbness, as well as not wearing proper footwear, can make you more likely to have a fall. Screenings, including: Osteoporosis screening. Osteoporosis is a condition that causes  the bones to get weaker and break more easily. Blood pressure screening. Blood pressure changes and medicines to control blood pressure can make you feel dizzy. Depression screening. You may be more likely to have a fall if you have a fear of falling, feel depressed, or feel unable to do activities that you used to do. Alcohol use screening. Using too much alcohol can affect your balance and may make you more likely to have a fall. Follow these instructions at home: Lifestyle Do not drink alcohol if: Your health care provider tells you not to drink. If you drink alcohol: Limit how much you have to: 0-1 drink a day for women. 0-2 drinks a day for men. Know how much alcohol is in your drink. In the [place] one drink equals one 12 oz bottle of beer (355 mL), one 5 oz glass of wine (148 mL), or one 1 oz glass of hard liquor (44 mL). Do not use any products that contain nicotine or tobacco. These products include cigarettes, chewing tobacco, and vaping devices, such as e-cigarettes. If you need help quitting, ask your health care provider. Activity  Follow a regular exercise program to stay fit. This will help you maintain your balance. Ask your health care provider what types of exercise are appropriate for you. If you need a cane or walker, use it as recommended by your health care provider. Wear supportive shoes that have nonskid soles. Safety  Remove any tripping hazards, such as rugs, cords, and clutter. Install safety equipment such as grab bars in bathrooms and safety rails on stairs. Keep rooms and walkways   well-lit. General instructions Talk with your health care provider about your risks for falling. Tell your health care provider if: You fall. Be sure to tell your health care provider about all falls, even ones that seem minor. You feel dizzy, tiredness (fatigue), or off-balance. Take over-the-counter and prescription medicines only as told by your health care provider. These include  supplements. Eat a healthy diet and maintain a healthy weight. A healthy diet includes low-fat dairy products, low-fat (lean) meats, and fiber from whole grains, beans, and lots of fruits and vegetables. Stay current with your vaccines. Schedule regular health, dental, and eye exams. Summary Having a healthy lifestyle and getting preventive care can help to protect your health and wellness after age 82. Screening and testing are the best way to find a health problem early and help you avoid having a fall. Early diagnosis and treatment give you the best chance for managing medical conditions that are more common for people who are older than age 82. Falls are a major cause of broken bones and head injuries in people who are older than age 82. Take precautions to prevent a fall at home. Work with your health care provider to learn what changes you can make to improve your health and wellness and to prevent falls. This information is not intended to replace advice given to you by your health care provider. Make sure you discuss any questions you have with your health care provider. Document Revised: 12/12/2020 Document Reviewed: 12/12/2020 Elsevier Patient Education  2022 [employer]  

## 2021-08-08 ENCOUNTER — Other Ambulatory Visit: Payer: Self-pay | Admitting: Emergency Medicine

## 2021-08-14 ENCOUNTER — Other Ambulatory Visit: Payer: Self-pay

## 2021-08-14 MED ORDER — ATORVASTATIN CALCIUM 20 MG PO TABS
20.0000 mg | ORAL_TABLET | Freq: Every day | ORAL | 3 refills | Status: DC
  Filled 2021-08-14 (×2): qty 90, 90d supply, fill #0

## 2021-08-14 MED ORDER — ALENDRONATE SODIUM 70 MG PO TABS
70.0000 mg | ORAL_TABLET | ORAL | 3 refills | Status: DC
  Filled 2021-08-14: qty 12, 84d supply, fill #0
  Filled 2021-08-14: qty 90, fill #0

## 2021-08-14 MED ORDER — TRAZODONE HCL 100 MG PO TABS
100.0000 mg | ORAL_TABLET | Freq: Every day | ORAL | 0 refills | Status: DC
  Filled 2021-08-14 (×2): qty 30, 30d supply, fill #0

## 2021-08-14 MED ORDER — CITALOPRAM HYDROBROMIDE 10 MG PO TABS
10.0000 mg | ORAL_TABLET | Freq: Every day | ORAL | 2 refills | Status: DC
  Filled 2021-08-14 (×3): qty 90, 90d supply, fill #0

## 2021-08-14 NOTE — Telephone Encounter (Signed)
Patient calling in  Requesting all these rxs be sent to [facility], [address] AT [address] [place]  Phone:  [phone] Fax:  [phone]

## 2021-08-15 ENCOUNTER — Other Ambulatory Visit: Payer: Self-pay

## 2021-08-16 ENCOUNTER — Ambulatory Visit: Payer: Medicare Other | Admitting: Neurology

## 2021-08-16 ENCOUNTER — Encounter: Payer: Medicare Other | Admitting: Neurology

## 2021-08-16 ENCOUNTER — Other Ambulatory Visit: Payer: Self-pay

## 2021-08-16 DIAGNOSIS — G5603 Carpal tunnel syndrome, bilateral upper limbs: Secondary | ICD-10-CM | POA: Diagnosis not present

## 2021-08-16 DIAGNOSIS — G5632 Lesion of radial nerve, left upper limb: Secondary | ICD-10-CM | POA: Diagnosis not present

## 2021-08-16 DIAGNOSIS — R29898 Other symptoms and signs involving the musculoskeletal system: Secondary | ICD-10-CM | POA: Diagnosis not present

## 2021-08-16 DIAGNOSIS — Z0289 Encounter for other administrative examinations: Secondary | ICD-10-CM

## 2021-08-16 NOTE — Progress Notes (Signed)
Full Name: Rhianon Zabawa" Winnifred Friar Gender: Female MRN #: [id number] Date of Birth: [birth year]    Visit Date: 08/16/2021 14:06 Age: 81 Years Examining Physician: Arlice Colt, MD  Referring Physician: Arlice Colt, MD Height: 5 feet 5 inch Patient History: 180lbs     History: Ms. Meda Coffee is an 81 year old woman who had the onset of left wrist and hand weakness in November 2022.  The worst weakness is with finger extension.  She has milder weakness with intrinsic hand muscles.  She denies any numbness or pain.  Details of her exam or in the visit note following the NCV/EMG study  Nerve conduction studies: The left radial motor response had a markedly reduced amplitude when recorded over the left EIP muscle but a normal amplitude when recorded over the ECR longus muscle. The right radial motor response had normal distal latency, amplitude and mildly reduced forearm conduction velocity. The left median motor response had a slightly reduced amplitude with normal distal latency and conduction velocity. The right median motor response had a mildly delayed distal latency and mildly reduced amplitude with normal forearm conduction. Bilateral ulnar motor responses had normal distal latencies, amplitudes and conduction velocities.  The left radial sensory response had a mildly reduced amplitude with normal peak latency and the right radial sensory response was normal. The left median sensory response had a borderline slowed peak latency with normal amplitude.  The right median sensory response had a delayed peak latency and reduced amplitude. Bilateral ulnar sensory responses had normal peak latencies and amplitudes.  Electromyography: Needle EMG of selected muscles of the left arm and hand was performed.  The left extensor digitorum communis muscle had mixed acute and moderate chronic denervation.  The left extensor indicis proprius muscle had mixed acute and severe chronic denervation.  There  was mild chronic denervation in the left abductor pollicis brevis muscle.  Other muscles in the arm including the extensor carpi radialis longus were normal.  Impression: This NCV/EMG study shows the following: 1.  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 2.   Moderate right and mild left median neuropathies at the wrist (carpal tunnel syndromes) 3.   Although mild weakness was noted in the ulnar innervated hand muscles during the physical examination, ulnar NCV and EMG was normal.  Tiffay Pinette A. Felecia Shelling, MD, PhD, FAAN Certified in Neurology, Clinical Neurophysiology, Sleep Medicine, Pain Medicine and Neuroimaging Director, [place] [address], [place], [place] [address] [phone]    Verbal informed consent was obtained from the patient, patient was informed of potential risk of procedure, including bruising, bleeding, hematoma formation, infection, muscle weakness, muscle pain, numbness, among others.        Colony    Nerve / Sites Muscle Latency Ref. Amplitude Ref. Rel Amp Segments Distance Velocity Ref. Area    ms ms mV mV %  cm m/s m/s mVms  L Median - APB     Wrist APB 4.3 ?4.4 3.3 ?4.0 100 Wrist - APB 7   11.6     Upper arm APB 8.2  3.1  94.8 Upper arm - Wrist 21 54 ?49  11.4  R Median - APB     Wrist APB 5.2 ?4.4 3.4 ?4.0 100 Wrist - APB 7   10.5     Upper arm APB 9.3  3.2  92.1 Upper arm - Wrist 20 49 ?49 10.1  L Ulnar - ADM     Wrist ADM 2.7 ?3.3 8.4 ?6.0 100 Wrist - ADM 7   20.1     B.Elbow ADM 5.7  7.7  91.4 B.Elbow - Wrist 17 57 ?49 19.8     A.Elbow ADM 7.6  7.6  99.1  A.Elbow - B.Elbow 10 53 ?49 19.6  R Ulnar - ADM     Wrist ADM 2.6 ?3.3 9.6 ?6.0 100 Wrist - ADM 7   22.2     B.Elbow ADM 6.0  8.4  87.2 B.Elbow - Wrist 17 49 ?49 22.0     A.Elbow ADM 8.0  8.2  98 A.Elbow - B.Elbow 10 49 ?49 21.3  L Radial - EIP     Forearm EIP 10.0 ?2.9 0.4 ?2.0 100 Forearm - EIP 4  ?49 2.1     Elbow ECR-L 2.6  2.5  577 Elbow - Forearm 4   12.6     Spiral Gr ECR-L 5.4  2.2  84.7 Spiral Gr - Elbow 11 40  11.3  R Radial - EIP     Forearm EIP 2.3 ?2.9 2.8 ?2.0 100 Forearm - EIP 4  ?49 12.7     Elbow EIP 4.9  2.4  86.8 Elbow - Forearm 11 43  10.5     Spiral Gr EIP 7.6  2.4  102 Spiral Gr - Elbow 12 45  13.3                  SNC    Nerve / Sites Rec. Site Peak Lat Ref.  Amp Ref. Segments Distance    ms ms V V  cm  L Radial - Anatomical snuff box (Forearm)     Forearm Wrist 2.8 ?2.9 9 ?15 Forearm - Wrist 10  R Radial - Anatomical snuff box (Forearm)     Forearm Wrist 2.6 ?2.9 18 ?15 Forearm - Wrist 10  L Median - Orthodromic (Dig II, Mid palm)     Dig II Wrist 3.4 ?3.4 10 ?10 Dig II - Wrist 13  R Median - Orthodromic (Dig II, Mid palm)     Dig II Wrist 4.3 ?3.4 6 ?10 Dig II - Wrist 13  L Ulnar - Orthodromic, (Dig V, Mid palm)     Dig V Wrist 3.0 ?3.1 5 ?5 Dig V - Wrist 11  R Ulnar - Orthodromic, (Dig V, Mid palm)     Dig V Wrist 2.7 ?3.1 5 ?5 Dig V - Wrist 23                 F  Wave    Nerve F Lat Ref.   ms ms  L Ulnar - ADM 29.0 ?32.0  R Ulnar - ADM 30.1 ?32.0         EMG Summary Table    Spontaneous MUAP Recruitment  Muscle IA Fib PSW Fasc Other Amp Dur. Poly Pattern  L. Deltoid Normal None None None _______ Normal Normal Normal Normal  L. Triceps brachii Normal None None None _______ Normal Normal Normal Normal  L. Biceps brachii Normal None None None _______ Normal Normal Normal Normal  L. Extensor digitorum communis Normal 1+ 1+ None _______ Normal Normal 2+ Discrete  L. First dorsal  interosseous Normal None None None _______ Normal Normal Normal Normal   L. Abductor pollicis brevis Normal None None None _______ Increased Increased 1+ Reduced  L. Flexor carpi ulnaris Normal None None None _______ Normal Normal 1+ Normal  L. Extensor indicis proprius Normal None 1+ None _______ Decreased Increased 4+ Single  L. Pronator quadratus Normal None None None _______ Normal Normal Normal Normal  L. Extensor carpi radialis longus Normal None None None _______ Normal Normal Normal Normal  L. Abductor digiti minimi (manus) Normal None None None _______ Normal Normal Normal Normal         [facility]  PATIENT: Sinclair Arrazola DOB: 1940/12/03  REFERRING DOCTOR OR PCP: Octaviano Glow MD  SOURCE: Patient, notes from primary care  _________________________________   HISTORICAL  CHIEF COMPLAINT:  Left arm weakness  HISTORY OF PRESENT ILLNESS:  Amyia Gerald Stabs) Peragine is an 81 year old woman with left arm weakness  Update 08/16/2021:  History of left arm weakness: She noted left wrist and finger weakness that started 06/08/2021.   She had been more active the previous week as she was moving a lot of boxes with relocation to this area.    SHe tried to hang a picture and noticed the weakness.   Finger extension is completely weak and wrist extension is > 80% weak.     She also has weakness in intrinsic hand muscles but strong finger flexrion and upper arm strength.  Due to a recent TIA and known right ICA stenosis, she was concerned about a stroke.  MRI of the brain did not show any acute findings.  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  MRI of the brachial plexus did not show any pathology of the plexus though there was a rotator cuff tear  She denies neck pain or left arm pain.  No urinary urgency or changes.    She denies numbness.      NCV today showed markedly reduced radial nerve amplitude when recorded over the EIP muscle but normal amplitude when recorded over the ECR  longus muscle.  Additionally there was median neuropathy, right greater than left.  EMG study today showed mixed acute and chronic denervation in the EIP and ECR longus muscles and mild chronic denervation in the APB muscle while other muscles were essentially normal.    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  She had amaurosis fugax on the right leading to additional tests.     Lipitor was prescribed but she stopped and we discussed her getting back on.     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  It shows minimal age appropriate generalized cortical atrophy and mild chronic microvascular ischemic changes in the pons and hemispheres.  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  MRI of the cervical spine 07/31/2021: 1.  At C3-C4, there is mild spinal stenosis due to anterolisthesis and other degenerative changes.  There did not appear to be any nerve root compression. 2.  At C4-C5, there is mild spinal stenosis due to degenerative changes.  There does not appear to be any nerve root compression 3.  At C5-C6, there is mild to moderate spinal  stenosis due to degenerative changes.  There does not appear to be any nerve root compression. 4.  At C6-C7, there is mild spinal stenosis due to degenerative changes and moderate left greater than right foraminal narrowing.  There is no definite nerve root compression though the degenerative changes encroach upon the left C7 nerve root. 5.  At C7-T1, there is minimal anterolisthesis and other degenerative change but no spinal stenosis or nerve root compression 6.  The spinal cord appears normal.  MRI of the brachial plexus 07/08/2021 was normal.  There was a full-thickness rotator cuff tear involving the supraspinatus  tendon.    REVIEW OF SYSTEMS: Constitutional: No fevers, chills, sweats, or change in appetite Eyes: No visual changes, double vision, eye pain Ear, nose and throat: No hearing loss, ear pain, nasal congestion, sore throat Cardiovascular: No chest pain, palpitations Respiratory:  No shortness of breath at rest or with exertion.   No wheezes GastrointestinaI: No nausea, vomiting, diarrhea, abdominal pain, fecal incontinence Genitourinary:  No dysuria, urinary retention or frequency.  No nocturia. Musculoskeletal:  No neck pain, back pain Integumentary: No rash, pruritus, skin lesions Neurological: as above Psychiatric: No depression at this time.  No anxiety Endocrine: No palpitations, diaphoresis, change in appetite, change in weigh or increased thirst Hematologic/Lymphatic:  No anemia, purpura, petechiae. Allergic/Immunologic: No itchy/runny eyes, nasal congestion, recent allergic reactions, rashes  ALLERGIES: Allergies  Allergen Reactions   Erythromycin Swelling    HOME MEDICATIONS:  Current Outpatient Medications:    alendronate (FOSAMAX) 70 MG tablet, Take 1 tablet (70 mg total) by mouth once a week., Disp: 90 tablet, Rfl: 3   ASPIRIN 81 PO, Take by mouth., Disp: , Rfl:    atorvastatin (LIPITOR) 20 MG tablet, Take 1 tablet (20 mg total) by mouth daily., Disp: 90 tablet, Rfl: 3   citalopram (CELEXA) 10 MG tablet, Take 1 tablet (10 mg total) by mouth daily., Disp: 90 tablet, Rfl: 2   traZODone (DESYREL) 100 MG tablet, Take 1 tablet (100 mg total) by mouth at bedtime., Disp: 30 tablet, Rfl: 0  PAST MEDICAL HISTORY: Past Medical History:  Diagnosis Date   Cancer ([place])    Breast Cancer   Pancreatic tumor     PAST SURGICAL HISTORY: Past Surgical History:  Procedure Laterality Date   APPENDECTOMY     SPLENECTOMY, TOTAL      FAMILY HISTORY: Family History  Problem Relation Age of Onset   Cancer - Ovarian Mother    Arthritis Father    Alzheimer's disease Sister     Arthritis Sister     SOCIAL HISTORY:  Social History   Socioeconomic History   Marital status: Widowed    Spouse name: Not on file   Number of children: 3   Years of education: Not on file   Highest education level: Master's degree (e.g., MA, MS, MEng, MEd, MSW, MBA)  Occupational History   Not on file  Tobacco Use   Smoking status: Former    Types: Cigarettes   Smokeless tobacco: Never  Substance and Sexual Activity   Alcohol use: Yes    Comment: rare   Drug use: Never   Sexual activity: Not on file  Other Topics Concern   Not on file  Social History Narrative   Right handed    Caffeine  cup per day   Lives at home alone    Social Determinants of Health   Financial Resource Strain: Not on file  Food Insecurity: Not on file  Transportation Needs:  Not on file  Physical Activity: Not on file  Stress: Not on file  Social Connections: Not on file  Intimate Partner Violence: Not on file     PHYSICAL EXAM  There were no vitals filed for this visit.   There is no height or weight on file to calculate BMI.   General: The patient is well-developed and well-nourished and in no acute distress  HEENT:  Head is [place]/AT.    Neck: Good range of motion.  The neck is nontender.  Skin: Extremities are without rash or  edema.  Neurologic Exam  Mental status: The patient is alert and oriented x 3 at the time of the examination. The patient has apparent normal recent and remote memory, with an apparently normal attention span and concentration ability.   Speech is normal.  Cranial nerves: Facial strength was normal  Motor:  Muscle bulk is normal.   Tone is normal. Strength is  5 / 5 in the right hand/arm and both legs.  Strength was 5/5 in all proximal muscles of the left arm and in the finger flexors, wrist flexors and pronators.  Strength was 4+/5 with left wrist extensors, and she had radial deviation, but only 1 to 2-/5 in finger extensors.  Ulnar intrinsic hand muscles  with palm on the bed was 4/5 though seemed 2/5 when tested with her in a seated position .  Median innervated APB muscle was 4/5.  Sensory: Sensory testing is intact to pinprick, soft touch and vibration sensation in all 4 extremities.  Reflexes: Deep tendon reflexes are symmetric and normal bilaterally.       DIAGNOSTIC DATA (LABS, IMAGING, TESTING) - I reviewed patient records, labs, notes, testing and imaging myself where available.  Lab Results  Component Value Date   WBC 8.2 06/08/2021   HGB 12.6 06/08/2021   HCT 37.0 06/08/2021   MCV 95.6 06/08/2021   PLT 300 06/08/2021      Component Value Date/Time   NA 140 06/08/2021 1544   K 3.8 06/08/2021 1544   CL 108 06/08/2021 1544   CO2 23 06/08/2021 1435   GLUCOSE 116 (H) 06/08/2021 1544   BUN 23 06/08/2021 1544   CREATININE 1.10 (H) 06/08/2021 1544   CALCIUM 9.1 06/08/2021 1435   PROT 6.4 (L) 06/08/2021 1435   ALBUMIN 3.6 06/08/2021 1435   AST 26 06/08/2021 1435   ALT 21 06/08/2021 1435   ALKPHOS 64 06/08/2021 1435   BILITOT 0.3 06/08/2021 1435   GFRNONAA 48 (L) 06/08/2021 1435        ASSESSMENT AND PLAN  Posterior interosseous mononeuropathy, left - Plan: Ambulatory referral to Occupational Therapy  Left arm weakness - Plan: NCV with EMG(electromyography), Ambulatory referral to Occupational Therapy  Bilateral carpal tunnel syndrome  A left posterior interosseous neuropathy best explains her symptoms, though would not explain the mild ulnar innervated hand weakness.  There is no evidence of focal myelopathy on cervical spine MRI that would offer a different diagnosis.   MRI of the left elbow to determine if there is a compressive lesion.  If so consider referral for decompressive surgery.    The MRI will also allow evaluation of atrophy pattern of the proximal forearm muscles. Occupational therapy. Follow-up will be arranged after imaging.  Call sooner if new or worsening symptoms.   Malaka Ruffner A. Felecia Shelling, MD,  [facility] 12/14/2583, 2:77 PM Certified in Neurology, Clinical Neurophysiology, Sleep Medicine and Neuroimaging  [facility] [address] [place],  [address] [phone]

## 2021-08-17 ENCOUNTER — Telehealth: Payer: Self-pay | Admitting: Neurology

## 2021-08-17 NOTE — Telephone Encounter (Signed)
[employer] medicare order sent to [facility], [facility] they will reach out to the patient to schedule.  

## 2021-08-23 ENCOUNTER — Other Ambulatory Visit: Payer: Self-pay

## 2021-08-23 ENCOUNTER — Ambulatory Visit: Payer: Medicare Other | Attending: Neurology | Admitting: Occupational Therapy

## 2021-08-23 ENCOUNTER — Encounter: Payer: Self-pay | Admitting: Occupational Therapy

## 2021-08-23 DIAGNOSIS — M6281 Muscle weakness (generalized): Secondary | ICD-10-CM | POA: Insufficient documentation

## 2021-08-23 DIAGNOSIS — R208 Other disturbances of skin sensation: Secondary | ICD-10-CM | POA: Diagnosis not present

## 2021-08-23 DIAGNOSIS — R278 Other lack of coordination: Secondary | ICD-10-CM | POA: Insufficient documentation

## 2021-08-23 NOTE — Therapy (Signed)
[facility] [address] [place], [place], [address] Phone: [phone]   Fax:  [phone]  Occupational Therapy Evaluation  Patient Details  Name: Nancy Duncan MRN: [id number] Date of Birth: 11-Jun-1941 Referring Provider (OT): Arlice Colt   Encounter Date: 08/23/2021   OT End of Session - 08/23/21 1338     Visit Number 1    Number of Visits 7    Date for OT Re-Evaluation 10/22/21    Authorization Type UHC Medicaid (follow Medicare guidelines?)    OT Start Time 1230    OT Stop Time 1310    OT Time Calculation (min) 40 min    Activity Tolerance Patient tolerated treatment well    Behavior During Therapy [facility] for tasks assessed/performed             Past Medical History:  Diagnosis Date   Cancer ([place])    Breast Cancer   Pancreatic tumor     Past Surgical History:  Procedure Laterality Date   APPENDECTOMY     SPLENECTOMY, TOTAL      There were no vitals filed for this visit.   Subjective Assessment - 08/23/21 1237     Subjective  Want to get better    Currently in Pain? No/denies               [facility] OT Assessment - 08/23/21 0001       Assessment   Medical Diagnosis Left arm weakness; Posterior Interosseous mononeuropathy    Referring Provider (OT) Arlice Colt    Onset Date/Surgical Date 06/08/21    Hand Dominance Right    Next MD Visit MRI Sunday 1/22    Prior Therapy NA      Precautions   Precautions None      Restrictions   Weight Bearing Restrictions No      Balance Screen   Has the patient fallen in the past 6 months Yes   twisted and sprained ankle while on vacation   How many times? 1    Has the patient had a decrease in activity level because of a fear of falling?  No    Is the patient reluctant to leave their home because of a fear of falling?  No      Prior Function   Level of Independence Independent;Independent with basic ADLs    Vocation Retired    Chief Technology Officer, read, tv, pickle ball,      ADL   Eating/Feeding Independent    Grooming Independent    Upper Body Bathing Independent    Lower Body Bathing Modified independent    Upper Body Dressing Increased time    Lower Body Dressing Increased time    Physicist, medical -  Hygiene Independent    Tub/Shower Transfer Independent    ADL comments Can use left hand for most ADL tasks - but lacks grip strength, the ability to open hand to shape onto objects.      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  Was used to do walking exercise video daily for strengthening and cardio      Cognition   Overall Cognitive Status Within Functional Limits for tasks assessed    Cognition Comments Patient bright, pleasant, able to clearly report history and problems associated with this visit      Observation/Other Assessments   Focus on Therapeutic Outcomes (FOTO)  NA      Posture/Postural Control   Posture/Postural Control No significant limitations      Sensation   Light Touch Appears Intact    Stereognosis Not tested    Hot/Cold Not tested    Additional Comments Patient reports possible mild diminished sensation more ulnar aspect of dorsum of left hand.  Patient with      Coordination   Gross Motor Movements are Fluid and Coordinated No    Fine Motor Movements are Fluid and Coordinated No      Perception   Perception Within Functional Limits      Praxis   Praxis Intact      Tone   Assessment Location Left Upper Extremity       ROM / Strength   AROM / PROM / Strength AROM;Strength      AROM   Overall AROM  Deficits    Overall AROM Comments WFL proximal - shoulder, elbow, deficits - MCP extension in digits, and flex, abduction, thumb - left      Strength   Overall Strength Deficits    Overall Strength Comments decreased wrist extension 8-/5+, unable to elicit MCP extension.  Grip and pinch strength less in LUE.      Left Hand AROM   L Thumb Radial ADduction/ABduction 0-55 --   Lacks active radial abduction   L Thumb Palmar ADduction/ABduction 0-45 --   Lacks active palmar abduction   L Index  MCP 0-90 --   Lacks MCP extension in 2-5     Hand Function   Right Hand Gross Grasp Functional    Right Hand Grip (lbs) 29.3    Right Hand Lateral Pinch 10 lbs    Right Hand 3 Point Pinch 8 lbs    Left Hand Gross Grasp Impaired    Left Hand Grip (lbs) 22.9   Uses wrist flexion with grip   Left Hand Lateral Pinch 8 lbs    Left 3 point pinch 4 lbs      LUE Tone   LUE Tone Hypotonic                              OT Education - 08/23/21 1337     Education Details Discussed pro's / con's splint - may help improve functional grip, allows more passive support to wrist.  May need to consider splint for wrist/finger position.    Person(s) Educated Patient    Methods Explanation    Comprehension Verbalized understanding              OT Short Term Goals - 08/23/21 1348       OT SHORT TERM GOAL #1   Title Patient will complete an HEP designed to improve wrist extension strength    Baseline No HEP    Time 4    Period Weeks    Status New    Target Date 09/22/21      OT SHORT TERM GOAL #2   Title Patient will report improved ability to grasp and release  cell phone in non dominant left hand to allow her to text, or search / scroll for info on phone    Baseline Reports dropping phone and having to have it repaires    Time 4    Period Weeks    Status New      OT SHORT TERM GOAL #3    Title Patient will verbalize understanding of splinting recommendations    Baseline Not currently wearing splint - wrist splint issued in hospital    Time 4    Period Weeks    Status New               OT Long Term Goals - 08/23/21 1351       OT [place] #1   Title Patient will complete updated HEP designed to improve hand strength, and functional use    Baseline No HEP    Time 8    Period Weeks    Status New    Target Date 10/22/21      OT LONG TERM GOAL #2   Title Patient will demonstrate improved ability to open bottles, bags with BUE and no more than intermittent min assist    Baseline Reports unable to open new bottle    Time 8    Period Weeks    Status New      OT LONG TERM GOAL #3   Title Patient will demonstrate ability to pick up and release lightweight (less than 3 lb) object x 5 without dropping    Baseline frequently dropping items with LUE    Time 8    Period Weeks    Status New      OT LONG TERM GOAL #4   Title Patient will demonstrate 3 lb increase in LUE grip strength    Baseline 22    Time 8    Period Weeks    Status New                   Plan - 08/23/21 1342     Clinical Impression Statement Patient is a bright, active 81 yr old woman referred by Neurology for posterior interosseous mononeuropathy.  Patient with PMH significant for pancreatic tumor, occlusion of R ICA.  Patient referred due to non-dominant LUE weakness.  Patient presents with decreased strength with wrist extension, and decreased muscle activation for 2-5 MCP extension, thumb radial palmar abduction.  Patient has undergone a number of tests, and is scheduled for elbow MRI on Sunday 1/22, in hopes of further clarification of her symptoms.  Patient will benefit from OT to improve functional use and strength in LUE, and/or learn compensatory strategies to maintain participation level in ADL/IADL.    OT Occupational Profile and History Problem Focused Assessment -  Including review of records relating to presenting problem    Occupational performance deficits (Please refer to evaluation for details): ADL's;IADL's    Body Structure / Function / Physical Skills ADL;Decreased knowledge of use of DME;Strength;Dexterity;Tone;Body mechanics;Edema;UE functional use;IADL;ROM;Coordination;Sensation;FMC    Rehab Potential Good    Clinical Decision Making Limited treatment options, no task modification necessary    Comorbidities Affecting Occupational Performance: None    Modification or Assistance to Complete Evaluation  No modification of tasks or assist necessary to complete eval    OT Frequency 1x / week    OT Duration 8 weeks    OT Treatment/Interventions Self-care/ADL training;Moist Heat;Fluidtherapy;DME and/or AE instruction;Splinting;Aquatic Therapy;Therapeutic activities;Ultrasound;Therapeutic exercise;Cryotherapy;Iontophoresis;Neuromuscular education;Patient/family education;Manual Therapy;Electrical Stimulation    Plan  Initiate HEP for wrist strength.  Objective test of coordiantion and perhaps coord goal    Consulted and Agree with Plan of Care Patient             Patient will benefit from skilled therapeutic intervention in order to improve the following deficits and impairments:   Body Structure / Function / Physical Skills: ADL, Decreased knowledge of use of DME, Strength, Dexterity, Tone, Body mechanics, Edema, UE functional use, IADL, ROM, Coordination, Sensation, [facility]       Visit Diagnosis: Muscle weakness (generalized) - Plan: Ot plan of care cert/re-cert  Other lack of coordination - Plan: Ot plan of care cert/re-cert  Other disturbances of skin sensation - Plan: Ot plan of care cert/re-cert    Problem List Patient Active Problem List   Diagnosis Date Noted   Posterior interosseous mononeuropathy, left 08/16/2021   Bilateral carpal tunnel syndrome 08/16/2021   History of TIA (transient ischemic attack) 08/02/2021   Pancreatic  tumor 08/02/2021   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   Ventral hernia without obstruction or gangrene 08/02/2021   Left arm weakness 06/15/2021   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

## 2021-08-25 ENCOUNTER — Ambulatory Visit: Payer: Self-pay | Admitting: Family Medicine

## 2021-08-27 ENCOUNTER — Other Ambulatory Visit: Payer: Self-pay

## 2021-08-27 ENCOUNTER — Ambulatory Visit
Admission: RE | Admit: 2021-08-27 | Discharge: 2021-08-27 | Disposition: A | Discharge status: Home / Self Care | Payer: Medicare Other | Source: Ambulatory Visit | Attending: Neurology | Admitting: Neurology

## 2021-08-27 DIAGNOSIS — R531 Weakness: Secondary | ICD-10-CM | POA: Diagnosis not present

## 2021-08-27 DIAGNOSIS — G5632 Lesion of radial nerve, left upper limb: Secondary | ICD-10-CM

## 2021-08-27 DIAGNOSIS — G629 Polyneuropathy, unspecified: Secondary | ICD-10-CM | POA: Diagnosis not present

## 2021-08-27 DIAGNOSIS — R600 Localized edema: Secondary | ICD-10-CM | POA: Diagnosis not present

## 2021-08-27 DIAGNOSIS — R29898 Other symptoms and signs involving the musculoskeletal system: Secondary | ICD-10-CM

## 2021-08-27 IMAGING — MR MR ELBOW*L* W/O CM
6 of 8 series · 27 of 40 positions shown · non-contrast
Comparison: None.

CLINICAL DATA: Left posterior interosseous neuropathy. Left hand
and arm weakness.

EXAM:
MRI OF THE LEFT ELBOW WITHOUT CONTRAST
TECHNIQUE: Multiplanar, multisequence MR imaging of the elbow was performed. No
intravenous contrast was administered.

[Series 6: T1 · axial · left · 3.0mm · 0.19mm/px · z∈[-7,+39]mm · 2 of 37 slices shown]
[im 1/37]
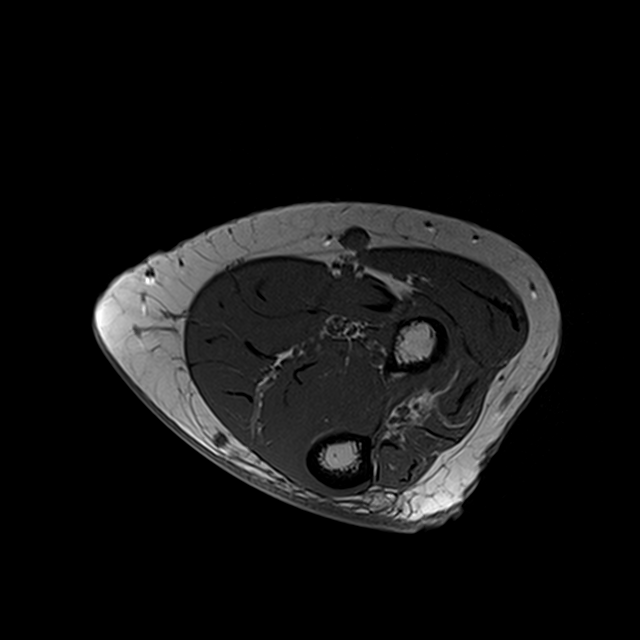
[im 13/37]
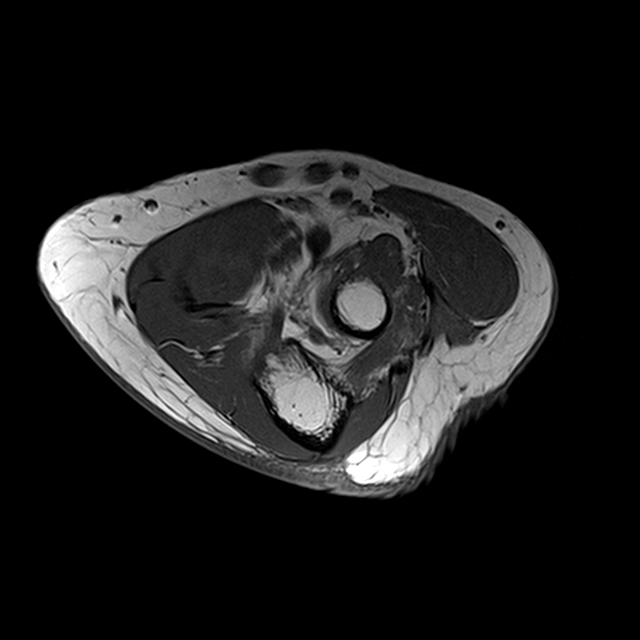

[Series 7: T2 fat-sat · axial · left · 3.0mm · 0.19mm/px · z∈[-9,+128]mm · 4 of 37 slices shown (1 of 3)]
[im 1/37]
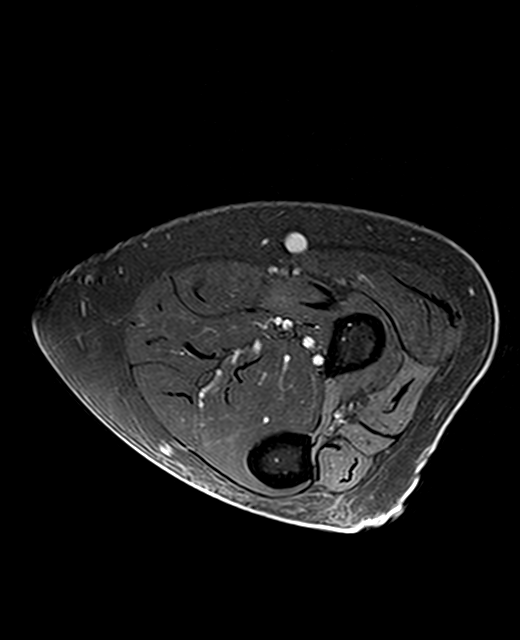
[im 13/37]
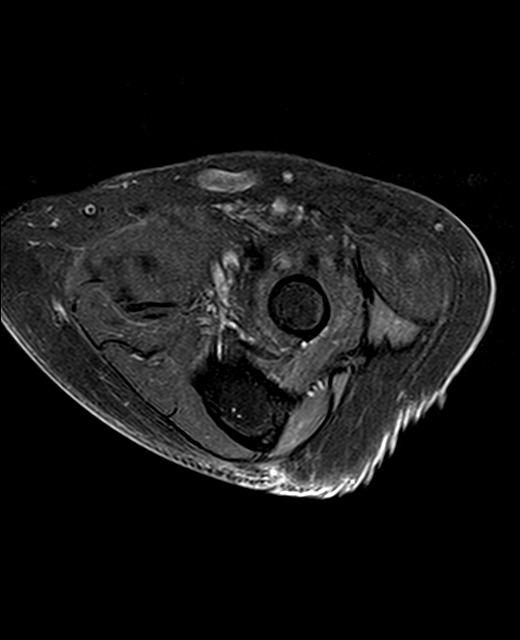
[im 25/37]
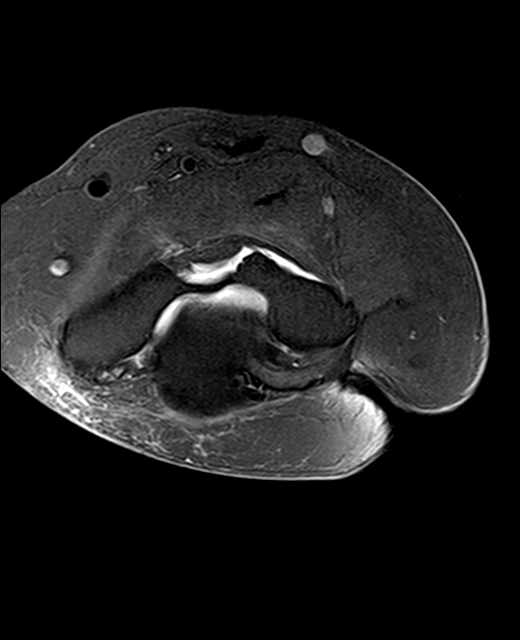
[im 37/37]
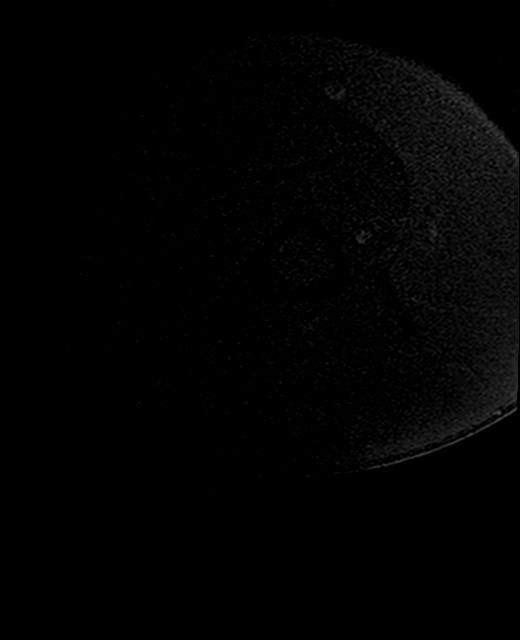

[Series 8: T2 fat-sat · coronal · left · 3.0mm · 0.23mm/px · 2 of 24 slices shown (2 of 3)]
[im 1/24]
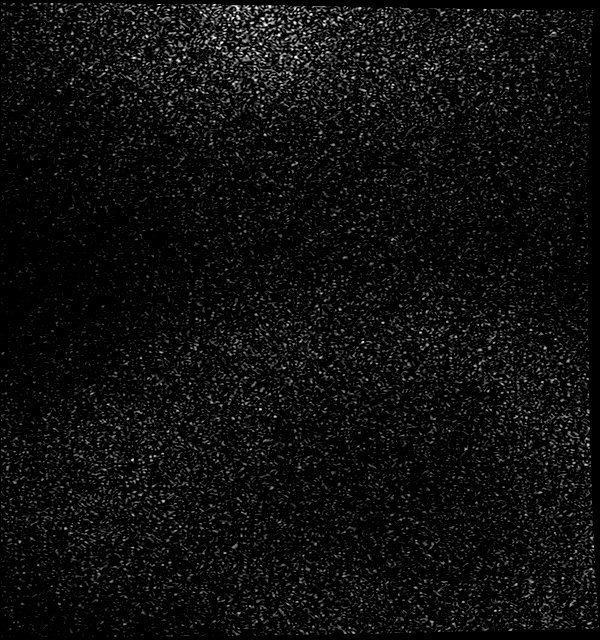
[im 24/24]
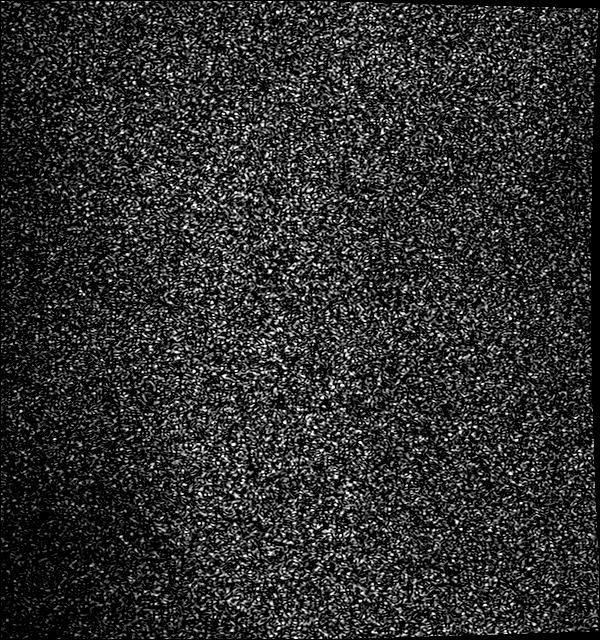

[Series 10: T2 fat-sat · sagittal · left · 3.0mm · 0.23mm/px · 3 of 26 slices shown (3 of 3)]
[im 1/26]
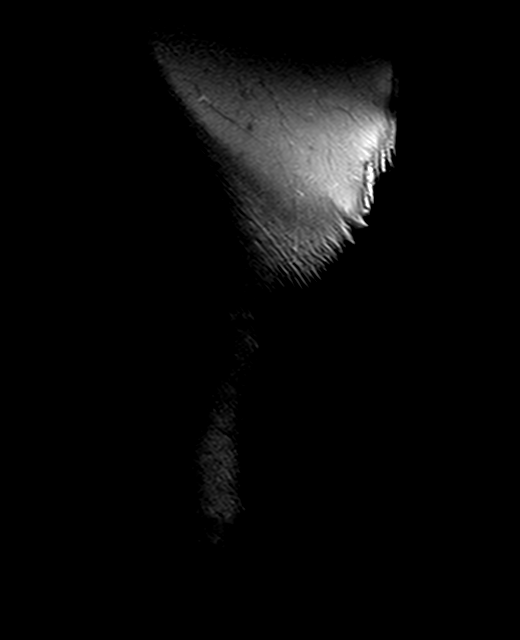
[im 13/26]
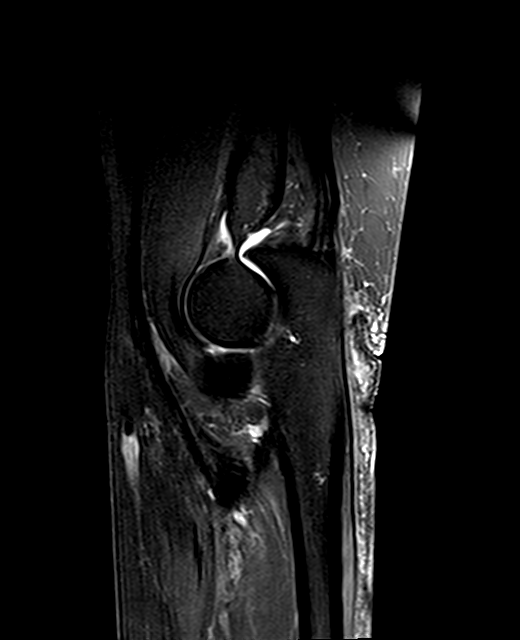
[im 26/26]
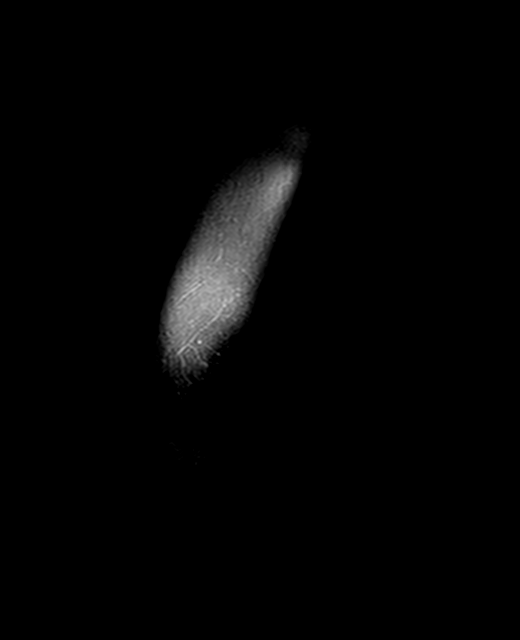

[Series 100: PD · axial · left · 1.5mm · 0.45mm/px · z∈[-41,+82]mm · 8 of 83 slices shown (1 of 2)]
[im 1/83]
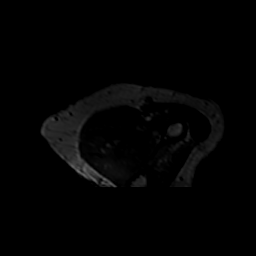
[im 12/83]
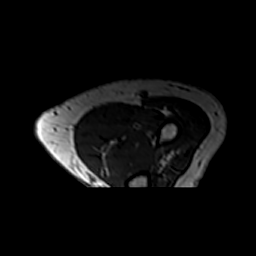
[im 24/83]
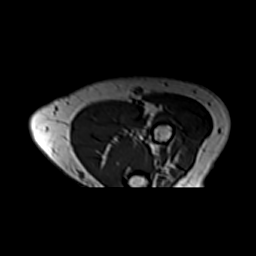
[im 36/83]
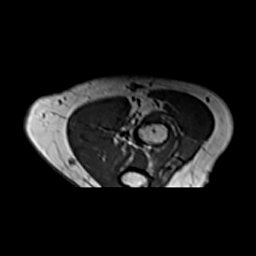
[im 47/83]
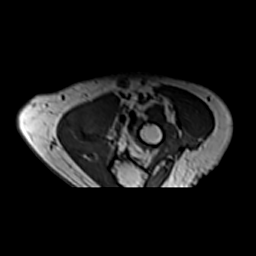
[im 59/83]
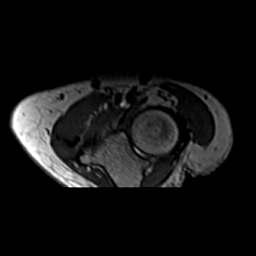
[im 71/83]
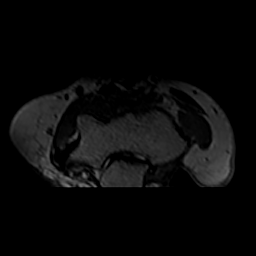
[im 83/83]
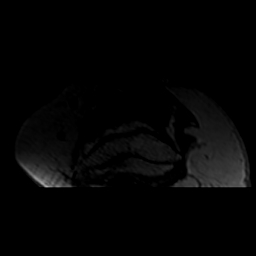

[Series 101: PD · sagittal · left · 1.0mm · 0.66mm/px · 8 of 77 slices shown (2 of 2)]
[im 1/77]
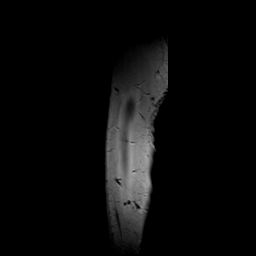
[im 11/77]
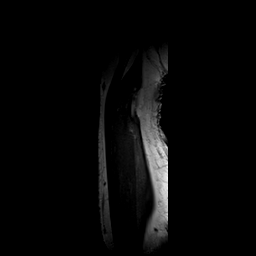
[im 22/77]
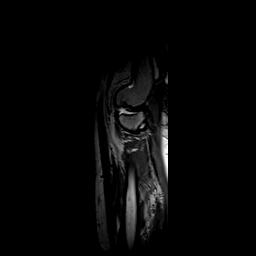
[im 33/77]
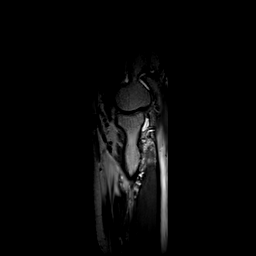
[im 44/77]
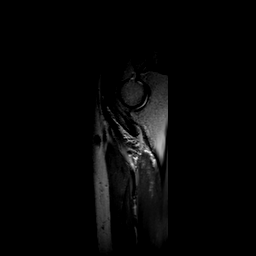
[im 55/77]
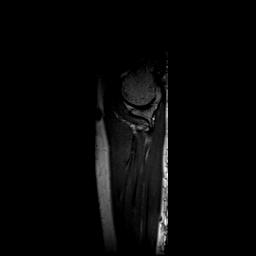
[im 66/77]
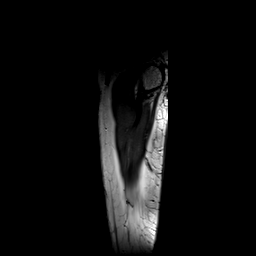
[im 77/77]
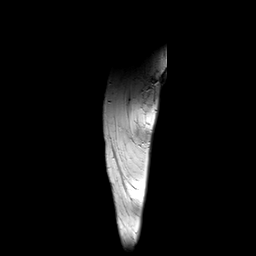

[27 of 40 positions shown; findings below may reference images not displayed]

FINDINGS: TENDONS

Common forearm flexor origin: Intact

Common forearm extensor origin: Intact

Biceps: Intact

Triceps: Intact

LIGAMENTS

Medial stabilizers: Intact

Lateral stabilizers:  Intact

Cartilage: No chondral defect.

Joint: No joint effusion. No synovitis.

Cubital tunnel: Normal. Ulnar nerve is normal in size and signal.

Bones: No fracture or dislocation. No marrow abnormality.

Soft Tissues: No fluid collection or hematoma. Faint T2 hyperintense
signal in the extensor carpi ulnaris muscle and proximal extensor
digitorum muscle as can be seen with neurogenic edema. Posterior
interosseous nerve is normal.
IMPRESSION: 1. Faint edema signal in the extensor carpi ulnaris muscle and
proximal extensor digitorum muscle as can be seen with neurogenic
edema. Posterior interosseous nerve is normal.
2. No internal derangement of the left elbow.

## 2021-08-28 DIAGNOSIS — D490 Neoplasm of unspecified behavior of digestive system: Secondary | ICD-10-CM | POA: Diagnosis not present

## 2021-08-28 DIAGNOSIS — D3A8 Other benign neuroendocrine tumors: Secondary | ICD-10-CM | POA: Diagnosis not present

## 2021-08-28 DIAGNOSIS — D120 Benign neoplasm of cecum: Secondary | ICD-10-CM | POA: Diagnosis not present

## 2021-08-29 ENCOUNTER — Other Ambulatory Visit: Payer: Self-pay

## 2021-08-29 ENCOUNTER — Ambulatory Visit: Payer: Medicare Other | Admitting: Occupational Therapy

## 2021-08-29 ENCOUNTER — Encounter: Payer: Self-pay | Admitting: Neurology

## 2021-08-29 ENCOUNTER — Encounter: Payer: Self-pay | Admitting: Occupational Therapy

## 2021-08-29 DIAGNOSIS — G5632 Lesion of radial nerve, left upper limb: Secondary | ICD-10-CM

## 2021-08-29 DIAGNOSIS — R278 Other lack of coordination: Secondary | ICD-10-CM | POA: Diagnosis not present

## 2021-08-29 DIAGNOSIS — M6281 Muscle weakness (generalized): Secondary | ICD-10-CM

## 2021-08-29 DIAGNOSIS — R29898 Other symptoms and signs involving the musculoskeletal system: Secondary | ICD-10-CM

## 2021-08-29 DIAGNOSIS — R208 Other disturbances of skin sensation: Secondary | ICD-10-CM | POA: Diagnosis not present

## 2021-08-29 NOTE — Telephone Encounter (Signed)
I called to go over the results of the MRI and got voicemail.  I left a message that I would call back.

## 2021-08-29 NOTE — Therapy (Signed)
[facility] [address] [place], [place], [address] Phone: [phone]   Fax:  [phone]  Occupational Therapy Treatment  Patient Details  Name: Nancy Duncan MRN: [id number] Date of Birth: 1941/07/17 Referring Provider (OT): Arlice Colt   Encounter Date: 08/29/2021   OT End of Session - 08/29/21 1355     Visit Number 2    Number of Visits 7    Date for OT Re-Evaluation 10/22/21    Authorization Type UHC Medicaid (follow Medicare guidelines?)    OT Start Time 1145    OT Stop Time 1235    OT Time Calculation (min) 50 min    Activity Tolerance Patient tolerated treatment well    Behavior During Therapy [facility] for tasks assessed/performed             Past Medical History:  Diagnosis Date   Cancer ([place])    Breast Cancer   Pancreatic tumor     Past Surgical History:  Procedure Laterality Date   APPENDECTOMY     SPLENECTOMY, TOTAL      There were no vitals filed for this visit.   Subjective Assessment - 08/29/21 1151     Subjective  They did not really find anything    Currently in Pain? Yes    Pain Score 3     Pain Location Hand    Pain Orientation Left    Pain Descriptors / Indicators Aching    Pain Type Acute pain    Pain Onset In the past 7 days                          OT Treatments/Exercises (OP) - 08/29/21 0001       ADLs   ADL Comments Reviewed short and long term goals.  Patient has not heard from Neuro re recent MRI Elbow - but she read the results, and is wondering if it may be worthwhile to pursue additional information as she and Dr Felecia Shelling discussed.      Neurological Re-education Exercises   Other Exercises 1 Worked in gravity eliminated position to address first wrist motion, then MCP extension.  Patient has active wrist extension, but has to use wrist flexion (like a tenodesis) to open hand to grasp.  She is able to sustain a grasp lightly - but with wrist flexion.   Worked on passisve then active assisted / guided MCP extension digits 2-5.  Then attempts for isolated finger extension.  Patient needs cueing to reduce effort and to slow down to activate isolated control.  Patient with active MCP extension this session!  Still lacks radial or palmer abduction.      Splinting   Splinting Fabricated base of custom radial nerve splint.  Need to complete next session.                    OT Education - 08/29/21 1354     Education Details guided MCP extension exercises with wrist in neutral    Person(s) Educated Patient    Methods Explanation;Demonstration;Tactile cues;Verbal cues    Comprehension Need further instruction;Returned demonstration              OT Short Term Goals - 08/29/21 1407       OT SHORT TERM GOAL #1   Title Patient will complete an HEP designed to improve wrist extension strength    Baseline No HEP    Time 4    Period  Weeks    Status On-going      OT SHORT TERM GOAL #2   Title Patient will report improved ability to grasp and release cell phone in non dominant left hand to allow her to text, or search / scroll for info on phone    Baseline Reports dropping phone and having to have it repaires    Time 4    Period Weeks    Status Achieved      OT SHORT TERM GOAL #3   Title Patient will verbalize understanding of splinting recommendations    Baseline Not currently wearing splint - wrist splint issued in hospital    Time 4    Period Weeks    Status On-going               OT Long Term Goals - 08/29/21 1407       OT LONG TERM GOAL #1   Title Patient will complete updated HEP designed to improve hand strength, and functional use    Baseline No HEP    Time 8    Period Weeks    Status On-going    Target Date 10/22/21      OT LONG TERM GOAL #2   Title Patient will demonstrate improved ability to open bottles, bags with BUE and no more than intermittent min assist    Baseline Reports unable to open new  bottle    Time 8    Period Weeks    Status On-going      OT LONG TERM GOAL #3   Title Patient will demonstrate ability to pick up and release lightweight (less than 3 lb) object x 5 without dropping    Baseline frequently dropping items with LUE    Time 8    Period Weeks    Status On-going      OT LONG TERM GOAL #4   Title Patient will demonstrate 3 lb increase in LUE grip strength    Baseline 22    Time 8    Period Weeks    Status On-going                   Plan - 08/29/21 1355     Clinical Impression Statement Patient frustrated with current status of her left hand.  Patient indicates that she did not learn anything new from recent MRI.  Patient has mild swelling in radial wrist/thumb - dorsum.  Issued edema glove.  Patient able to isolate slight MCP extension in gravity eliminated position.    OT Occupational Profile and History Problem Focused Assessment - Including review of records relating to presenting problem    Occupational performance deficits (Please refer to evaluation for details): ADL's;IADL's    Body Structure / Function / Physical Skills ADL;Decreased knowledge of use of DME;Strength;Dexterity;Tone;Body mechanics;Edema;UE functional use;IADL;ROM;Coordination;Sensation;FMC    Rehab Potential Good    Clinical Decision Making Limited treatment options, no task modification necessary    Comorbidities Affecting Occupational Performance: None    Modification or Assistance to Complete Evaluation  No modification of tasks or assist necessary to complete eval    OT Frequency 1x / week    OT Duration 8 weeks    OT Treatment/Interventions Self-care/ADL training;Moist Heat;Fluidtherapy;DME and/or AE instruction;Splinting;Aquatic Therapy;Therapeutic activities;Ultrasound;Therapeutic exercise;Cryotherapy;Iontophoresis;Neuromuscular education;Patient/family education;Manual Therapy;Printmaker    Plan Initiate HEP for wrist strength.  Objective test of  coordination and perhaps coord goal    Consulted and Agree with Plan of Care Patient  Patient will benefit from skilled therapeutic intervention in order to improve the following deficits and impairments:   Body Structure / Function / Physical Skills: ADL, Decreased knowledge of use of DME, Strength, Dexterity, Tone, Body mechanics, Edema, UE functional use, IADL, ROM, Coordination, Sensation, [facility]       Visit Diagnosis: Muscle weakness (generalized)  Other lack of coordination  Other disturbances of skin sensation    Problem List Patient Active Problem List   Diagnosis Date Noted   Posterior interosseous mononeuropathy, left 08/16/2021   Bilateral carpal tunnel syndrome 08/16/2021   History of TIA (transient ischemic attack) 08/02/2021   Pancreatic tumor 08/02/2021   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   Ventral hernia without obstruction or gangrene 08/02/2021   Left arm weakness 06/15/2021   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 [address] [place], [place], [address] Phone: [phone]   Fax:  [phone]  Name: Nancy Duncan MRN: [id number] Date of Birth: 02-27-1941

## 2021-08-31 NOTE — Telephone Encounter (Signed)
I spoke to Nancy Duncan and reviewed the MRI results.  Clinically and by EMG she has a posterior interosseous neuropathy.  There were some changes in a couple of the muscles innervated by that nerve.  I would like her to see an upper extremity orthopedic surgeon to evaluate for possible nerve release surgery.

## 2021-09-04 ENCOUNTER — Telehealth: Payer: Self-pay | Admitting: Neurology

## 2021-09-04 NOTE — Telephone Encounter (Signed)
Sent to [facility] for Dr. Amedeo Plenty Ph # [phone]

## 2021-09-05 ENCOUNTER — Ambulatory Visit: Payer: Medicare Other | Admitting: Occupational Therapy

## 2021-09-05 ENCOUNTER — Encounter: Payer: Self-pay | Admitting: Occupational Therapy

## 2021-09-05 ENCOUNTER — Other Ambulatory Visit: Payer: Self-pay

## 2021-09-05 ENCOUNTER — Encounter: Payer: Self-pay | Admitting: Neurology

## 2021-09-05 DIAGNOSIS — R208 Other disturbances of skin sensation: Secondary | ICD-10-CM | POA: Diagnosis not present

## 2021-09-05 DIAGNOSIS — M6281 Muscle weakness (generalized): Secondary | ICD-10-CM | POA: Diagnosis not present

## 2021-09-05 DIAGNOSIS — R278 Other lack of coordination: Secondary | ICD-10-CM | POA: Diagnosis not present

## 2021-09-05 NOTE — Therapy (Signed)
[place] [address] [place], [place], [address] Phone: [phone]   Fax:  [phone]  Occupational Therapy Treatment  Patient Details  Name: Nancy Duncan MRN: [id number] Date of Birth: 1940/11/08 Referring Provider (OT): Arlice Colt   Encounter Date: 09/05/2021   OT End of Session - 09/05/21 1638     Visit Number 3    Number of Visits 7    Date for OT Re-Evaluation 10/22/21    Authorization Type UHC Medicaid (follow Medicare guidelines?)    OT Start Time 1230    OT Stop Time 1315    OT Time Calculation (min) 45 min    Activity Tolerance Patient tolerated treatment well    Behavior During Therapy [facility] for tasks assessed/performed             Past Medical History:  Diagnosis Date   Cancer ([place])    Breast Cancer   Pancreatic tumor     Past Surgical History:  Procedure Laterality Date   APPENDECTOMY     SPLENECTOMY, TOTAL      There were no vitals filed for this visit.   Subjective Assessment - 09/05/21 1634     Subjective  I have been trying to work with my fingers.  My thumb is moving a little more now.  I am scheduled to see Dr Amedeo Plenty    Currently in Pain? No/denies    Pain Score 0-No pain                          OT Treatments/Exercises (OP) - 09/05/21 0001       ADLs   ADL Comments Patient is not scheduled again for therapy until 2/22 - which is 2 days after appointment with hand orthopedic surgeon.  Patient was instructed to call if she was being scheduled for surgery and needed to cancel OT appointment.  If not a surgical candidate - will plan to attend.      Neurological Re-education Exercises   Other Exercises 1 Patient now with thumb movement - palmar abduction.  Patient leads all digit motion with strong wrist flexion.  Working to increase MCP extension with more neutral wrist position.  Patient today could initiate first ~30degrees of motion at MCP.      Splinting    Splinting Completed radial nerve palsy splint - Made adjustments to strapping to improve circulation.  Patient able to don and doff brace after initial instruction.  Patient will wear splint one hour today and then remove and check for any signs of pressure or circulation problems.  Patient to wear splint for exercise 20-60 min at a time - not to be worn all day.                      OT Short Term Goals - 09/05/21 1639       OT SHORT TERM GOAL #1   Title Patient will complete an HEP designed to improve wrist extension strength    Baseline No HEP    Time 4    Period Weeks    Status On-going      OT SHORT TERM GOAL #2   Title Patient will report improved ability to grasp and release cell phone in non dominant left hand to allow her to text, or search / scroll for info on phone    Baseline Reports dropping phone and having to have it repaires    Time  4    Period Weeks    Status Achieved      OT SHORT TERM GOAL #3   Title Patient will verbalize understanding of splinting recommendations    Baseline Not currently wearing splint - wrist splint issued in hospital    Time 4    Period Weeks    Status On-going               OT Long Term Goals - 09/05/21 1640       OT LONG TERM GOAL #1   Title Patient will complete updated HEP designed to improve hand strength, and functional use    Baseline No HEP    Time 8    Period Weeks    Status On-going    Target Date 10/22/21      OT LONG TERM GOAL #2   Title Patient will demonstrate improved ability to open bottles, bags with BUE and no more than intermittent min assist    Baseline Reports unable to open new bottle    Time 8    Period Weeks    Status On-going      OT LONG TERM GOAL #3   Title Patient will demonstrate ability to pick up and release lightweight (less than 3 lb) object x 5 without dropping    Baseline frequently dropping items with LUE    Time 8    Period Weeks    Status On-going      OT LONG TERM  GOAL #4   Title Patient will demonstrate 3 lb increase in LUE grip strength    Baseline 22    Time 8    Period Weeks    Status On-going                   Plan - 09/05/21 1638     Clinical Impression Statement Patient scheduled for brief vacation with family.  Will see ortho surgeon on 2/20 regarding nerve release.  Scheduled for next OT visit on 2/22    OT Occupational Profile and History Problem Focused Assessment - Including review of records relating to presenting problem    Occupational performance deficits (Please refer to evaluation for details): ADL's;IADL's    Body Structure / Function / Physical Skills ADL;Decreased knowledge of use of DME;Strength;Dexterity;Tone;Body mechanics;Edema;UE functional use;IADL;ROM;Coordination;Sensation;FMC    Rehab Potential Good    Clinical Decision Making Limited treatment options, no task modification necessary    Comorbidities Affecting Occupational Performance: None    Modification or Assistance to Complete Evaluation  No modification of tasks or assist necessary to complete eval    OT Frequency 1x / week    OT Duration 8 weeks    OT Treatment/Interventions Self-care/ADL training;Moist Heat;Fluidtherapy;DME and/or AE instruction;Splinting;Aquatic Therapy;Therapeutic activities;Ultrasound;Therapeutic exercise;Cryotherapy;Iontophoresis;Neuromuscular education;Patient/family education;Manual Therapy;Printmaker    Plan Initiate HEP for wrist strength.  Objective test of coordination    Consulted and Agree with Plan of Care Patient             Patient will benefit from skilled therapeutic intervention in order to improve the following deficits and impairments:   Body Structure / Function / Physical Skills: ADL, Decreased knowledge of use of DME, Strength, Dexterity, Tone, Body mechanics, Edema, UE functional use, IADL, ROM, Coordination, Sensation, FMC       Visit Diagnosis: Muscle weakness (generalized)  Other lack  of coordination  Other disturbances of skin sensation    Problem List Patient Active Problem List   Diagnosis Date Noted   Posterior  interosseous mononeuropathy, 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

## 2021-09-06 ENCOUNTER — Encounter: Payer: Self-pay | Admitting: Emergency Medicine

## 2021-09-13 ENCOUNTER — Other Ambulatory Visit: Payer: Self-pay

## 2021-09-13 ENCOUNTER — Encounter: Payer: Self-pay | Admitting: Emergency Medicine

## 2021-09-13 ENCOUNTER — Ambulatory Visit (INDEPENDENT_AMBULATORY_CARE_PROVIDER_SITE_OTHER): Payer: Medicare Other | Admitting: Emergency Medicine

## 2021-09-13 VITALS — BP 122/78 | HR 92 | Ht 65.0 in | Wt 175.0 lb

## 2021-09-13 DIAGNOSIS — K5903 Drug induced constipation: Secondary | ICD-10-CM

## 2021-09-13 DIAGNOSIS — Z1231 Encounter for screening mammogram for malignant neoplasm of breast: Secondary | ICD-10-CM | POA: Diagnosis not present

## 2021-09-13 NOTE — Progress Notes (Signed)
Nancy Duncan 81 y.o.   Chief Complaint  Patient presents with   Constipation    Pt states she believes it was do to Trazodone medication    HISTORY OF PRESENT ILLNESS: This is a 81 y.o. female complaining of constipation that started last week. Thought it was secondary to trazodone.  Decreased dose and bowel movements starting to come back now. No rectal bleeding.  Denies abdominal pain.  Denies nausea or vomiting.  Able to eat and drink. Drinking more water and eating more fruits and vegetables which is helping. No other complaints or medical concerns today.  Constipation Pertinent negatives include no abdominal pain, fever, nausea or vomiting.    Prior to Admission medications   Medication Sig Start Date End Date Taking? Authorizing Provider  alendronate (FOSAMAX) 70 MG tablet Take 1 tablet (70 mg total) by mouth once a week. 08/14/21   Horald Pollen, MD  ASPIRIN 81 PO Take by mouth.    [provider]  atorvastatin (LIPITOR) 20 MG tablet Take 1 tablet (20 mg total) by mouth daily. 08/14/21   Horald Pollen, MD  citalopram (CELEXA) 10 MG tablet Take 1 tablet (10 mg total) by mouth daily. 08/14/21   Horald Pollen, MD  traZODone (DESYREL) 100 MG tablet Take 1 tablet (100 mg total) by mouth at bedtime. Patient not taking: Reported on 09/13/2021 08/14/21   Horald Pollen, MD    Allergies  Allergen Reactions   Erythromycin Swelling    Patient Active Problem List   Diagnosis Date Noted   Posterior interosseous mononeuropathy, left 08/16/2021   Bilateral carpal tunnel syndrome 08/16/2021   History of TIA (transient ischemic attack) 08/02/2021   Pancreatic tumor 08/02/2021   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   Ventral hernia without obstruction or gangrene 08/02/2021   Left arm weakness 06/15/2021   Occlusion of right internal carotid artery 06/15/2021   TIA (transient ischemic attack) 06/15/2021    Past Medical History:  Diagnosis Date   Cancer  [facility])    Breast Cancer   Pancreatic tumor     Past Surgical History:  Procedure Laterality Date   APPENDECTOMY     SPLENECTOMY, TOTAL      Social History   Socioeconomic History   Marital status: Widowed    Spouse name: Not on file   Number of children: 3   Years of education: Not on file   Highest education level: Master's degree (e.g., MA, MS, MEng, MEd, MSW, MBA)  Occupational History   Not on file  Tobacco Use   Smoking status: Former    Types: Cigarettes   Smokeless tobacco: Never  Substance and Sexual Activity   Alcohol use: Yes    Comment: rare   Drug use: Never   Sexual activity: Not on file  Other Topics Concern   Not on file  Social History Narrative   Right handed    Caffeine  cup per day   Lives at home alone    Social Determinants of Health   Financial Resource Strain: Not on file  Food Insecurity: Not on file  Transportation Needs: Not on file  Physical Activity: Not on file  Stress: Not on file  Social Connections: Not on file  Intimate Partner Violence: Not on file    Family History  Problem Relation Age of Onset   Cancer - Ovarian Mother    Arthritis Father    Alzheimer's disease Sister    Arthritis Sister      Review of  Systems  Constitutional: Negative.  Negative for chills and fever.  HENT: Negative.  Negative for congestion and sore throat.   Respiratory: Negative.  Negative for cough and shortness of breath.   Gastrointestinal:  Positive for constipation. Negative for abdominal pain, blood in stool, nausea and vomiting.  Genitourinary: Negative.  Negative for dysuria and hematuria.  Skin: Negative.  Negative for rash.  Neurological: Negative.  Negative for dizziness and headaches.  All other systems reviewed and are negative.  Today's Vitals   09/13/21 1551  BP: 122/78  Pulse: 92  SpO2: 96%  Weight: 175 lb (79.4 kg)  Height: 5\' 5"  (1.651 m)   Body mass index is 29.12 kg/m.  Physical Exam Vitals reviewed.   Constitutional:      Appearance: Normal appearance.  HENT:     Head: Normocephalic.  Eyes:     Extraocular Movements: Extraocular movements intact.     Pupils: Pupils are equal, round, and reactive to light.  Cardiovascular:     Rate and Rhythm: Normal rate.  Pulmonary:     Effort: Pulmonary effort is normal.  Abdominal:     General: There is no distension.     Palpations: Abdomen is soft.     Tenderness: There is no abdominal tenderness. There is no guarding.     Hernia: A hernia (Ventral hernia) is present.  Skin:    General: Skin is warm and dry.  Neurological:     Mental Status: She is alert and oriented to person, place, and time.     ASSESSMENT & PLAN: Stable.  No red flag signs or symptoms.  Steadily improving. Benign abdominal examination.  Stable vital signs. Most likely secondary to trazodone and perhaps calcium supplementation. Continue intake of fruits, vegetables, fiber supplements. Stool softeners as needed. Problem List Items Addressed This Visit   None Visit Diagnoses     Drug-induced constipation    -  Primary   Encounter for screening mammogram for malignant neoplasm of breast       Relevant Orders   MM Digital Screening      Patient Instructions  Constipation, Adult Constipation is when a person has trouble pooping (having a bowel movement). When you have this condition, you may poop fewer than 3 times a week. Your poop (stool) may also be dry, hard, or bigger than normal. Follow these instructions at home: Eating and drinking  Eat foods that have a lot of fiber, such as: Fresh fruits and vegetables. Whole grains. Beans. Eat less of foods that are low in fiber and high in fat and sugar, such as: [place] fries. Hamburgers. Cookies. Candy. Soda. Drink enough fluid to keep your pee (urine) pale yellow. General instructions Exercise regularly or as told by your doctor. Try to do 150 minutes of exercise each week. Go to the restroom when you  feel like you need to poop. Do not hold it in. Take over-the-counter and prescription medicines only as told by your doctor. These include any fiber supplements. When you poop: Do deep breathing while relaxing your lower belly (abdomen). Relax your pelvic floor. The pelvic floor is a group of muscles that support the rectum, bladder, and intestines (as well as the uterus in women). Watch your condition for any changes. Tell your doctor if you notice any. Keep all follow-up visits as told by your doctor. This is important. Contact a doctor if: You have pain that gets worse. You have a fever. You have not pooped for 4 days. You vomit. You  are not hungry. You lose weight. You are bleeding from the opening of the butt (anus). You have thin, pencil-like poop. Get help right away if: You have a fever, and your symptoms suddenly get worse. You leak poop or have blood in your poop. Your belly feels hard or bigger than normal (bloated). You have very bad belly pain. You feel dizzy or you faint. Summary Constipation is when a person poops fewer than 3 times a week, has trouble pooping, or has poop that is dry, hard, or bigger than normal. Eat foods that have a lot of fiber. Drink enough fluid to keep your pee (urine) pale yellow. Take over-the-counter and prescription medicines only as told by your doctor. These include any fiber supplements. This information is not intended to replace advice given to you by your health care provider. Make sure you discuss any questions you have with your health care provider. Document Revised: 06/10/2019 Document Reviewed: 06/10/2019 Elsevier Patient Education  2022 [place], MD [facility]

## 2021-09-13 NOTE — Patient Instructions (Signed)

## 2021-09-21 ENCOUNTER — Ambulatory Visit: Payer: Medicare Other | Admitting: Emergency Medicine

## 2021-09-25 DIAGNOSIS — M79642 Pain in left hand: Secondary | ICD-10-CM | POA: Diagnosis not present

## 2021-09-25 DIAGNOSIS — G5632 Lesion of radial nerve, left upper limb: Secondary | ICD-10-CM | POA: Diagnosis not present

## 2021-09-27 ENCOUNTER — Ambulatory Visit: Payer: Medicare Other | Admitting: Occupational Therapy

## 2021-10-04 ENCOUNTER — Encounter: Payer: Medicare Other | Admitting: Occupational Therapy

## 2021-10-05 ENCOUNTER — Encounter: Payer: Self-pay | Admitting: Emergency Medicine

## 2021-10-10 DIAGNOSIS — C7A8 Other malignant neuroendocrine tumors: Secondary | ICD-10-CM | POA: Diagnosis not present

## 2021-10-11 ENCOUNTER — Encounter: Payer: Medicare Other | Admitting: Occupational Therapy

## 2021-10-17 ENCOUNTER — Encounter: Payer: Medicare Other | Admitting: Occupational Therapy

## 2021-10-20 DIAGNOSIS — Z9012 Acquired absence of left breast and nipple: Secondary | ICD-10-CM | POA: Diagnosis not present

## 2021-10-20 DIAGNOSIS — E875 Hyperkalemia: Secondary | ICD-10-CM | POA: Diagnosis not present

## 2021-10-20 DIAGNOSIS — Z789 Other specified health status: Secondary | ICD-10-CM | POA: Diagnosis not present

## 2021-10-20 DIAGNOSIS — G629 Polyneuropathy, unspecified: Secondary | ICD-10-CM | POA: Diagnosis not present

## 2021-10-20 DIAGNOSIS — I6523 Occlusion and stenosis of bilateral carotid arteries: Secondary | ICD-10-CM | POA: Diagnosis not present

## 2021-10-20 DIAGNOSIS — Z9049 Acquired absence of other specified parts of digestive tract: Secondary | ICD-10-CM | POA: Diagnosis not present

## 2021-10-20 DIAGNOSIS — Z7982 Long term (current) use of aspirin: Secondary | ICD-10-CM | POA: Diagnosis not present

## 2021-10-20 DIAGNOSIS — Z8739 Personal history of other diseases of the musculoskeletal system and connective tissue: Secondary | ICD-10-CM | POA: Diagnosis not present

## 2021-10-20 DIAGNOSIS — Z881 Allergy status to other antibiotic agents status: Secondary | ICD-10-CM | POA: Diagnosis not present

## 2021-10-20 DIAGNOSIS — E871 Hypo-osmolality and hyponatremia: Secondary | ICD-10-CM | POA: Diagnosis not present

## 2021-10-20 DIAGNOSIS — Z9081 Acquired absence of spleen: Secondary | ICD-10-CM | POA: Diagnosis not present

## 2021-10-20 DIAGNOSIS — R42 Dizziness and giddiness: Secondary | ICD-10-CM | POA: Diagnosis not present

## 2021-10-20 DIAGNOSIS — Z923 Personal history of irradiation: Secondary | ICD-10-CM | POA: Diagnosis not present

## 2021-10-20 DIAGNOSIS — Z8673 Personal history of transient ischemic attack (TIA), and cerebral infarction without residual deficits: Secondary | ICD-10-CM | POA: Diagnosis not present

## 2021-10-20 DIAGNOSIS — R404 Transient alteration of awareness: Secondary | ICD-10-CM | POA: Diagnosis not present

## 2021-10-20 DIAGNOSIS — I6521 Occlusion and stenosis of right carotid artery: Secondary | ICD-10-CM | POA: Diagnosis not present

## 2021-10-20 DIAGNOSIS — Z853 Personal history of malignant neoplasm of breast: Secondary | ICD-10-CM | POA: Diagnosis not present

## 2021-10-20 DIAGNOSIS — Z79899 Other long term (current) drug therapy: Secondary | ICD-10-CM | POA: Diagnosis not present

## 2021-10-20 DIAGNOSIS — Z87891 Personal history of nicotine dependence: Secondary | ICD-10-CM | POA: Diagnosis not present

## 2021-10-31 ENCOUNTER — Other Ambulatory Visit: Payer: Self-pay

## 2021-10-31 ENCOUNTER — Ambulatory Visit
Admission: RE | Admit: 2021-10-31 | Discharge: 2021-10-31 | Disposition: A | Discharge status: Home / Self Care | Payer: Medicare Other | Source: Ambulatory Visit | Attending: Emergency Medicine | Admitting: Emergency Medicine

## 2021-10-31 DIAGNOSIS — Z1231 Encounter for screening mammogram for malignant neoplasm of breast: Secondary | ICD-10-CM

## 2021-10-31 IMAGING — MG MM DIGITAL SCREENING BILAT W/ TOMO AND CAD
6 of 10 series · 6 of 30 positions shown · non-contrast
Comparison: Previous exam(s).

CLINICAL DATA: Screening.

EXAM:
DIGITAL SCREENING BILATERAL MAMMOGRAM WITH TOMOSYNTHESIS AND CAD
TECHNIQUE: Bilateral screening digital craniocaudal and mediolateral oblique
mammograms were obtained. Bilateral screening digital breast
tomosynthesis was performed. The images were evaluated with
computer-aided detection.

[R MLO synth-2D]
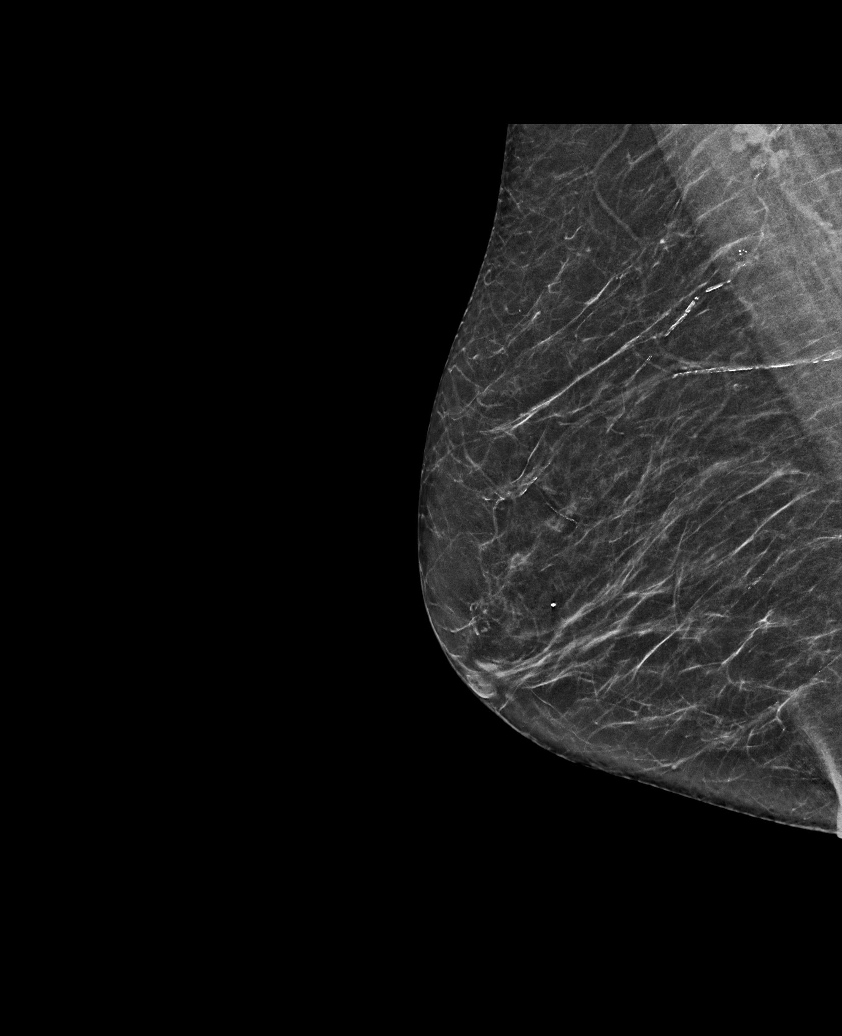

[L CC synth-2D]
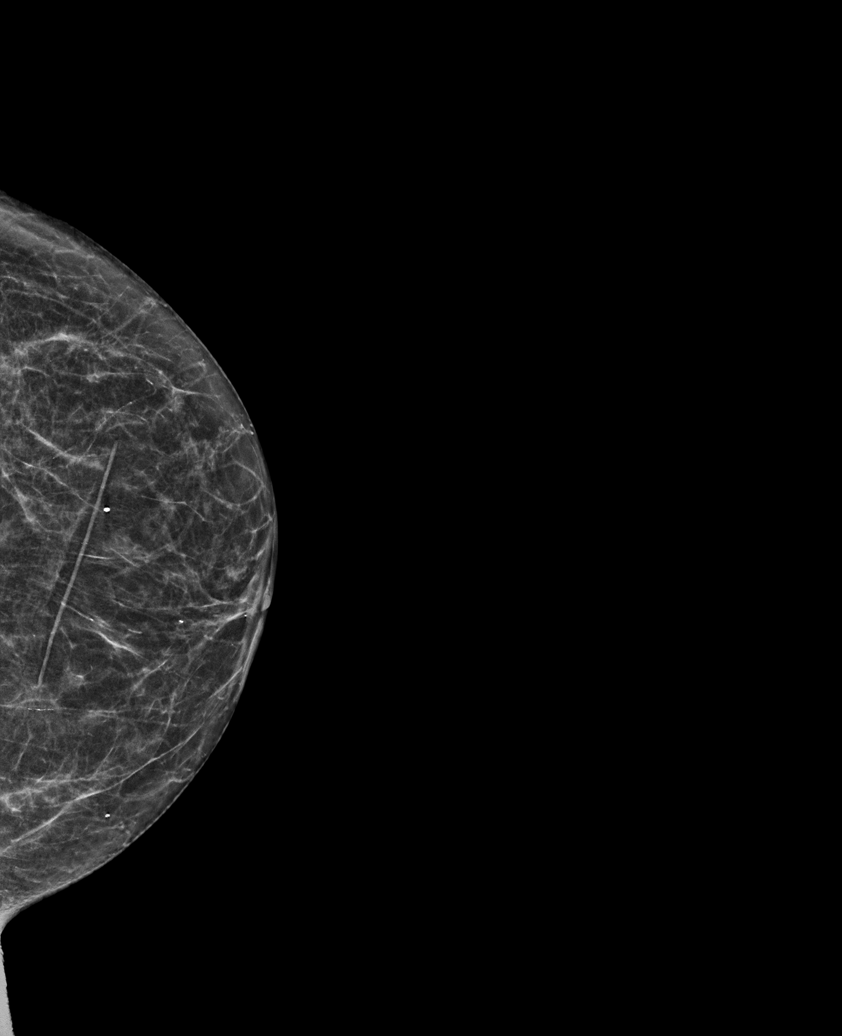

[L MLO synth-2D]
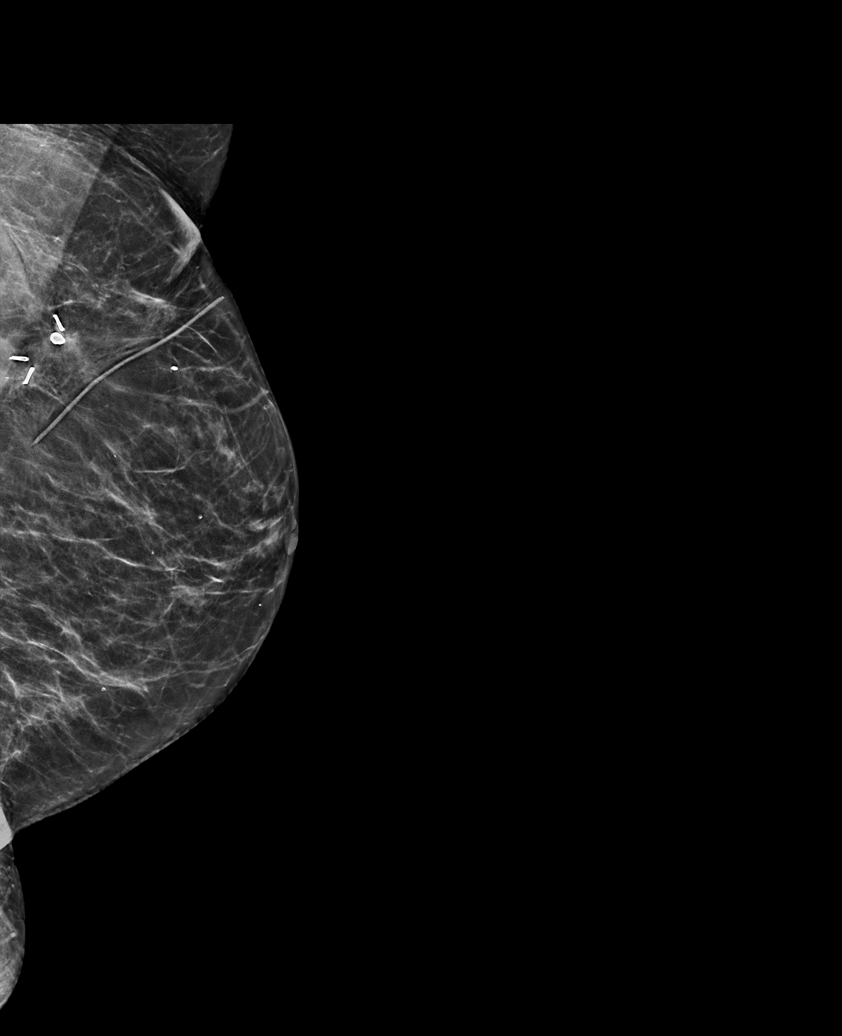

[L XCCL synth-2D]
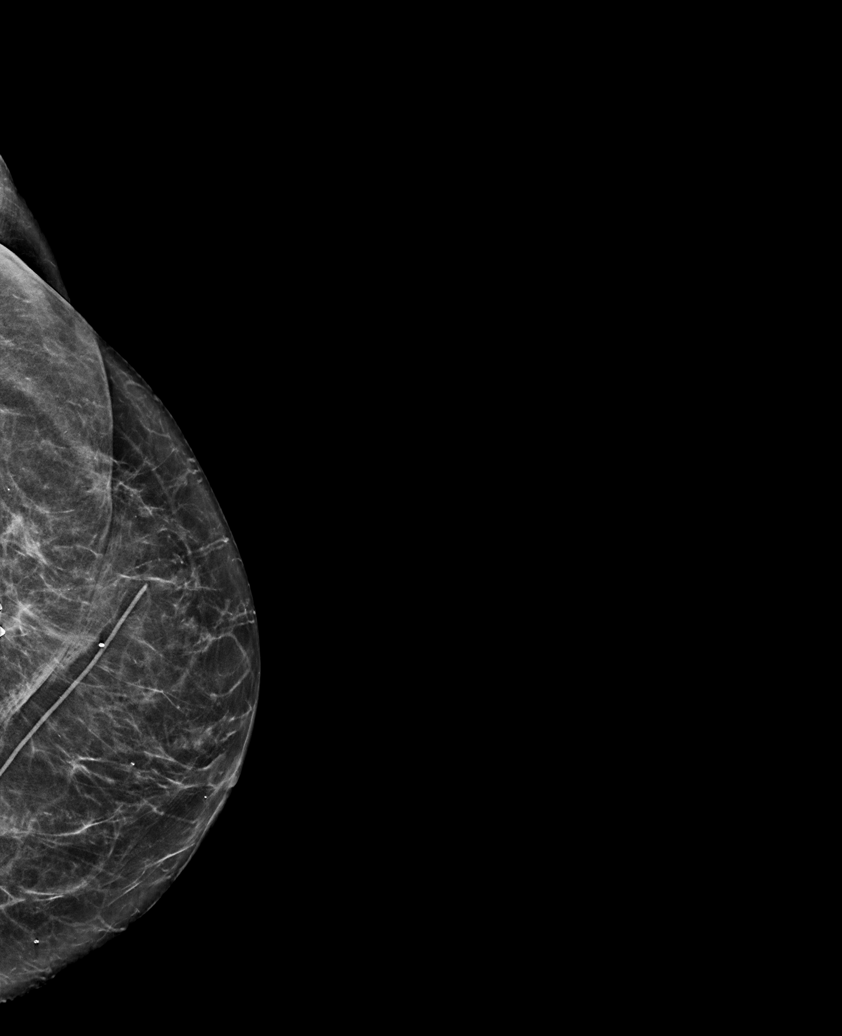

[R CC synth-2D]
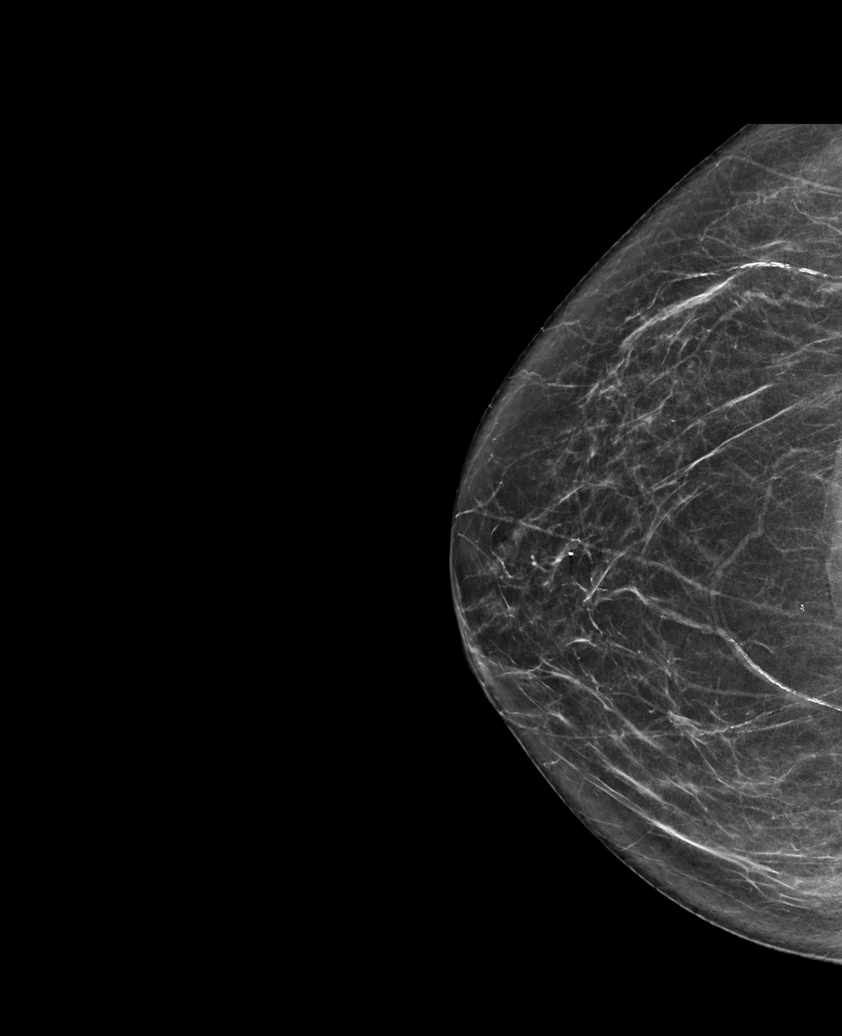

[L CC tomo · tomo slice 25/50.0]
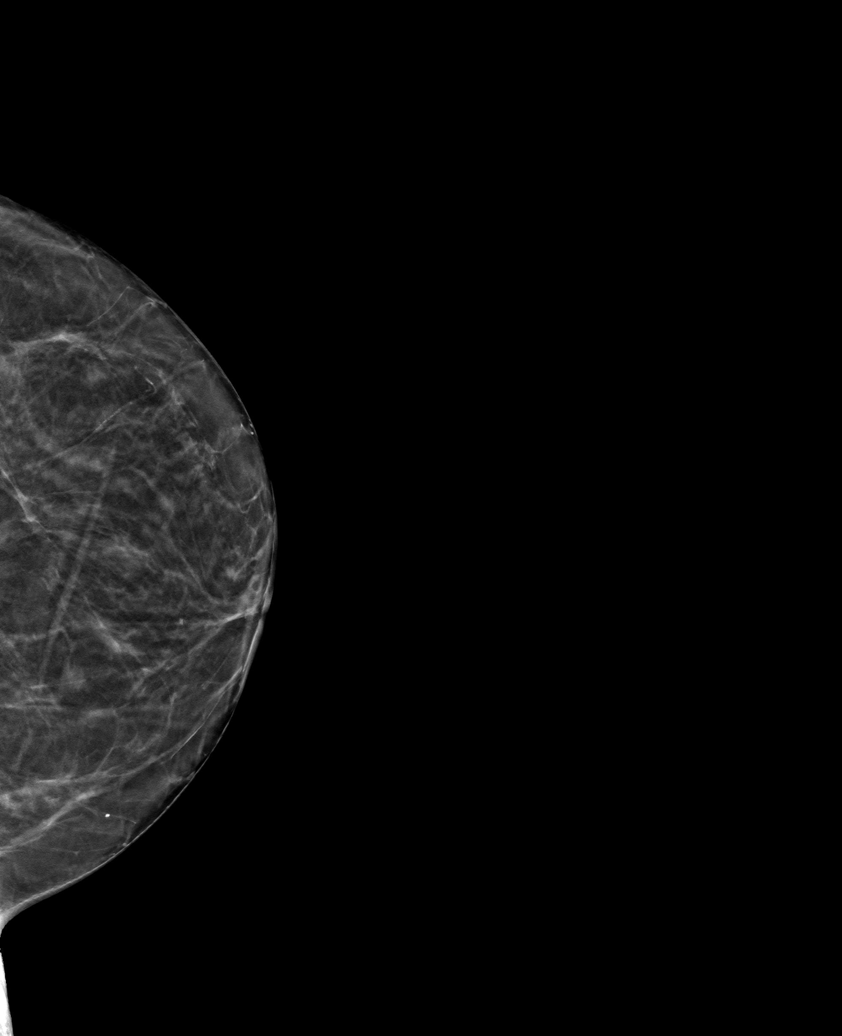

[6 of 30 positions shown; findings below may reference images not displayed]

ACR Breast Density Category b: There are scattered areas of
fibroglandular density.
FINDINGS: There are no findings suspicious for malignancy.
IMPRESSION: No mammographic evidence of malignancy. A result letter of this
screening mammogram will be mailed directly to the patient.

RECOMMENDATION:
Screening mammogram in one year. (Code:[BY])

BI-RADS CATEGORY  1: Negative.

## 2021-11-03 DIAGNOSIS — H353132 Nonexudative age-related macular degeneration, bilateral, intermediate dry stage: Secondary | ICD-10-CM | POA: Diagnosis not present

## 2021-11-03 DIAGNOSIS — H401132 Primary open-angle glaucoma, bilateral, moderate stage: Secondary | ICD-10-CM | POA: Diagnosis not present

## 2021-11-07 ENCOUNTER — Encounter: Payer: Self-pay | Admitting: Emergency Medicine

## 2021-11-07 DIAGNOSIS — K439 Ventral hernia without obstruction or gangrene: Secondary | ICD-10-CM

## 2021-11-08 NOTE — Telephone Encounter (Signed)
Thank you :)

## 2021-11-08 NOTE — Telephone Encounter (Signed)
Okay to place referral to general surgeon for hernia but where is the location of the hernia.  Surgeon needs to know and it should be included in the referral.  Thanks.

## 2021-11-08 NOTE — Telephone Encounter (Signed)
Per last OV patient has an ventral abd hernia. Referral to Gen surgery placed.  ?

## 2021-11-23 DIAGNOSIS — G5632 Lesion of radial nerve, left upper limb: Secondary | ICD-10-CM | POA: Diagnosis not present

## 2021-11-23 DIAGNOSIS — M79642 Pain in left hand: Secondary | ICD-10-CM | POA: Diagnosis not present

## 2021-12-11 ENCOUNTER — Other Ambulatory Visit: Payer: Self-pay | Admitting: Emergency Medicine

## 2021-12-11 ENCOUNTER — Encounter: Payer: Self-pay | Admitting: Emergency Medicine

## 2021-12-11 MED ORDER — HYDROXYZINE HCL 25 MG PO TABS: 25.0000 mg | ORAL_TABLET | Freq: Every evening | ORAL | 1 refills | Status: DC | PRN

## 2021-12-11 NOTE — Telephone Encounter (Signed)
Prescription for Vistaril sent to pharmacy of record.  Thanks.

## 2021-12-19 ENCOUNTER — Other Ambulatory Visit: Payer: Self-pay | Admitting: Surgery

## 2021-12-19 DIAGNOSIS — K432 Incisional hernia without obstruction or gangrene: Secondary | ICD-10-CM | POA: Diagnosis not present

## 2021-12-25 DIAGNOSIS — Z90410 Acquired total absence of pancreas: Secondary | ICD-10-CM | POA: Diagnosis not present

## 2021-12-25 DIAGNOSIS — K439 Ventral hernia without obstruction or gangrene: Secondary | ICD-10-CM | POA: Diagnosis not present

## 2021-12-25 DIAGNOSIS — Z90411 Acquired partial absence of pancreas: Secondary | ICD-10-CM | POA: Diagnosis not present

## 2021-12-25 DIAGNOSIS — Z9081 Acquired absence of spleen: Secondary | ICD-10-CM | POA: Diagnosis not present

## 2021-12-25 DIAGNOSIS — Z79899 Other long term (current) drug therapy: Secondary | ICD-10-CM | POA: Diagnosis not present

## 2021-12-25 DIAGNOSIS — Z87891 Personal history of nicotine dependence: Secondary | ICD-10-CM | POA: Diagnosis not present

## 2021-12-25 DIAGNOSIS — C7A8 Other malignant neuroendocrine tumors: Secondary | ICD-10-CM | POA: Diagnosis not present

## 2022-01-12 ENCOUNTER — Ambulatory Visit (INDEPENDENT_AMBULATORY_CARE_PROVIDER_SITE_OTHER): Payer: Medicare Other

## 2022-01-12 DIAGNOSIS — Z0000 Encounter for general adult medical examination without abnormal findings: Secondary | ICD-10-CM

## 2022-01-12 NOTE — Progress Notes (Signed)
I connected with Nancy Duncan today by telephone and verified that I am speaking with the correct person using two identifiers. Location patient: home Location provider: work Persons participating in the virtual visit: patient, provider.   I discussed the limitations, risks, security and privacy concerns of performing an evaluation and management service by telephone and the availability of in person appointments. I also discussed with the patient that there may be a patient responsible charge related to this service. The patient expressed understanding and verbally consented to this telephonic visit.    Interactive audio and video telecommunications were attempted between this provider and patient, however failed, due to patient having technical difficulties OR patient did not have access to video capability.  We continued and completed visit with audio only.  Some vital signs may be absent or patient reported.   Time Spent with patient on telephone encounter: 30 minutes  Subjective:   Nancy Duncan is a 81 y.o. female who presents for an Initial Medicare Annual Wellness Visit.  Review of Systems     Cardiac Risk Factors include: advanced age (>70mn, >>35women);dyslipidemia     Objective:    There were no vitals filed for this visit. There is no height or weight on file to calculate BMI.     01/12/2022    1:06 PM  Advanced Directives  Does Patient Have a Medical Advance Directive? Yes  Type of Advance Directive Living will;Healthcare Power of Attorney  Does patient want to make changes to medical advance directive? No - Patient declined  Copy of [place] Chart? No - copy requested    Current Medications (verified) Outpatient Encounter Medications as of 01/12/2022  Medication Sig   alendronate (FOSAMAX) 70 MG tablet Take 1 tablet (70 mg total) by mouth once a week.   ASPIRIN 81 PO Take by mouth.   atorvastatin (LIPITOR) 20 MG tablet Take 1 tablet (20 mg  total) by mouth daily. (Patient taking differently: Take 20 mg by mouth daily. 1/2 tablet every other day)   citalopram (CELEXA) 10 MG tablet Take 1 tablet (10 mg total) by mouth daily.   hydrOXYzine (ATARAX) 25 MG tablet Take 1 tablet (25 mg total) by mouth at bedtime as needed. (Patient not taking: Reported on 01/12/2022)   [DISCONTINUED] traZODone (DESYREL) 100 MG tablet Take 1 tablet (100 mg total) by mouth at bedtime. (Patient not taking: Reported on 09/13/2021)   No facility-administered encounter medications on file as of 01/12/2022.    Allergies (verified) Erythromycin   History: Past Medical History:  Diagnosis Date   Cancer ([facility] [place]    Breast Cancer   Pancreatic tumor    Past Surgical History:  Procedure Laterality Date   APPENDECTOMY     SPLENECTOMY, TOTAL     Family History  Problem Relation Age of Onset   Cancer - Ovarian Mother    Arthritis Father    Alzheimer's disease Sister    Arthritis Sister    Social History   Socioeconomic History   Marital status: Widowed    Spouse name: Not on file   Number of children: 3   Years of education: Not on file   Highest education level: Master's degree (e.g., MA, MS, MEng, MEd, MSW, MBA)  Occupational History   Not on file  Tobacco Use   Smoking status: Former    Types: Cigarettes   Smokeless tobacco: Never  Substance and Sexual Activity   Alcohol use: Yes    Comment: rare   Drug  use: Never   Sexual activity: Not on file  Other Topics Concern   Not on file  Social History Narrative   Right handed    Caffeine  cup per day   Lives at home alone    Social Determinants of Health   Financial Resource Strain: Low Risk  (01/12/2022)   Overall Financial Resource Strain (CARDIA)    Difficulty of Paying Living Expenses: Not hard at all  Food Insecurity: No Food Insecurity (01/12/2022)   Hunger Vital Sign    Worried About Running Out of Food in the Last Year: Never true    Ran Out of Food in the Last Year: Never true   Transportation Needs: No Transportation Needs (01/12/2022)   PRAPARE - Hydrologist (Medical): No    Lack of Transportation (Non-Medical): No  Physical Activity: Sufficiently Active (01/12/2022)   Exercise Vital Sign    Days of Exercise per Week: 5 days    Minutes of Exercise per Session: 30 min  Stress: No Stress Concern Present (01/12/2022)   [place]    Feeling of Stress : Not at all  Social Connections: [place] (01/12/2022)   Social Connection and Isolation Panel [NHANES]    Frequency of Communication with Friends and Family: More than three times a week    Frequency of Social Gatherings with Friends and Family: More than three times a week    Attends Religious Services: More than 4 times per year    Active Member of [employer] or Organizations: Yes    Attends Music therapist: More than 4 times per year    Marital Status: Married    Tobacco Counseling Counseling given: Not Answered   Clinical Intake:  Pre-visit preparation completed: Yes  Pain : No/denies pain     BMI - recorded: 29.12 Nutritional Status: BMI 25 -29 Overweight Nutritional Risks: None Diabetes: No  How often do you need to have someone help you when you read instructions, pamphlets, or other written materials from your doctor or pharmacy?: 1 - Never What is the last grade level you completed in school?: Master's Degree  Diabetic? no  Interpreter Needed?: No  Information entered by :: Lisette Abu, LPN.   Activities of Daily Living    01/12/2022    1:22 PM  In your present state of health, do you have any difficulty performing the following activities:  Hearing? 0  Vision? 0  Difficulty concentrating or making decisions? 0  Walking or climbing stairs? 0  Dressing or bathing? 0  Doing errands, shopping? 0  Preparing Food and eating ? N  Using the Toilet? N  In the past six  months, have you accidently leaked urine? N  Do you have problems with loss of bowel control? N  Managing your Medications? N  Managing your Finances? N  Housekeeping or managing your Housekeeping? N    Patient Care Team: Horald Pollen, MD as PCP - General (Internal Medicine)  Indicate any recent Medical Services you may have received from other than [facility] providers in the past year (date may be approximate).     Assessment:   This is a routine wellness examination for [place].  Hearing/Vision screen Hearing Screening - Comments:: Patient denied any hearing difficulty.   No hearing aids.  Vision Screening - Comments:: Patient does wear readers. Cataracts removed. Eye exam done by: [facility]   Dietary issues and exercise activities discussed: Current  Exercise Habits: Home exercise routine, Type of exercise: walking;Other - see comments (tennis, pickle ball), Time (Minutes): 30, Frequency (Times/Week): 5, Weekly Exercise (Minutes/Week): 150, Intensity: Moderate, Exercise limited by: None identified   Goals Addressed             This Visit's Progress    My goal is to lose 10 pounds.        Depression Screen    01/12/2022    1:19 PM 08/02/2021    2:55 PM  PHQ 2/9 Scores  PHQ - 2 Score 0 0    Fall Risk    01/12/2022    1:07 PM 08/02/2021    2:55 PM  [place] in the past year? 1 0  Number falls in past yr: 0 0  Injury with Fall? 0 0  Risk for fall due to : No Fall Risks   Follow up Falls evaluation completed     [place]:  Any stairs in or around the home? No  If so, are there any without handrails? No  Home free of loose throw rugs in walkways, pet beds, electrical cords, etc? Yes  Adequate lighting in your home to reduce risk of falls? Yes   ASSISTIVE DEVICES UTILIZED TO PREVENT FALLS:  Life alert? Yes  Use of a cane, walker or w/c? No  Grab bars in the bathroom? Yes  Shower chair or bench in  shower? No  Elevated toilet seat or a handicapped toilet? No   TIMED UP AND GO:  Was the test performed? No .  Length of time to ambulate 10 feet: n/a sec.   Appearance of gait: Patient not evaluated for gait during this visit.  Cognitive Function:        01/12/2022    1:23 PM  6CIT Screen  What Year? 0 points  What month? 0 points  What time? 0 points  Count back from 20 0 points  Months in reverse 0 points  Repeat phrase 0 points  Total Score 0 points    Immunizations Immunization History  Administered Date(s) Administered   Influenza-Unspecified 04/25/2021   PNEUMOCOCCAL CONJUGATE-20 08/02/2021    TDAP status: Due, Education has been provided regarding the importance of this vaccine. Advised may receive this vaccine at local pharmacy or Health Dept. Aware to provide a copy of the vaccination record if obtained from local pharmacy or Health Dept. Verbalized acceptance and understanding.  Flu Vaccine status: Up to date  Pneumococcal vaccine status: Up to date  Covid-19 vaccine status: Completed vaccines  Qualifies for Shingles Vaccine? Yes   Zostavax completed No   Shingrix Completed?: No.    Education has been provided regarding the importance of this vaccine. Patient has been advised to call insurance company to determine out of pocket expense if they have not yet received this vaccine. Advised may also receive vaccine at local pharmacy or Health Dept. Verbalized acceptance and understanding.  Screening Tests Health Maintenance  Topic Date Due   COVID-19 Vaccine (1) Never done   TETANUS/TDAP  Never done   Zoster Vaccines- Shingrix (1 of 2) Never done   DEXA SCAN  Never done   INFLUENZA VACCINE  03/06/2022   Pneumonia Vaccine 66+ Years old  Completed   HPV VACCINES  Aged Out    Health Maintenance  Health Maintenance Due  Topic Date Due   COVID-19 Vaccine (1) Never done   TETANUS/TDAP  Never done   Zoster Vaccines- Shingrix (1  of 2) Never done   DEXA  SCAN  Never done    Colorectal cancer screening: No longer required.   Mammogram status: Completed 10/31/2021. Repeat every year  Bone Density status: never done/no record  Lung Cancer Screening: (Low Dose CT Chest recommended if Age 73-80 years, 30 pack-year currently smoking OR have quit w/in 15years.) does not qualify.   Lung Cancer Screening Referral: no  Additional Screening:  Hepatitis C Screening: does not qualify; Completed no  Vision Screening: Recommended annual ophthalmology exams for early detection of glaucoma and other disorders of the eye. Is the patient up to date with their annual eye exam?  Yes  Who is the provider or what is the name of the office in which the patient attends annual eye exams? [facility] If pt is not established with a provider, would they like to be referred to a provider to establish care? No .   Dental Screening: Recommended annual dental exams for proper oral hygiene  Community Resource Referral / Chronic Care Management: CRR required this visit?  No   CCM required this visit?  No      Plan:     I have personally reviewed and noted the following in the patient's chart:   Medical and social history Use of alcohol, tobacco or illicit drugs  Current medications and supplements including opioid prescriptions. Patient is not currently taking opioid prescriptions. Functional ability and status Nutritional status Physical activity Advanced directives List of other physicians Hospitalizations, surgeries, and ER visits in previous 12 months Vitals Screenings to include cognitive, depression, and falls Referrals and appointments  In addition, I have reviewed and discussed with patient certain preventive protocols, quality metrics, and best practice recommendations. A written personalized care plan for preventive services as well as general preventive health recommendations were provided to patient.     Sheral Flow,  LPN   02/08/1951   Nurse Notes:  There were no vitals filed for this visit. There is no height or weight on file to calculate BMI. Patient stated that she has no issues with gait or balance; does not use any assistive devices.

## 2022-01-12 NOTE — Patient Instructions (Signed)
Ms. Nancy Duncan , Thank you for taking time to come for your Medicare Wellness Visit. I appreciate your ongoing commitment to your health goals. Please review the following plan we discussed and let me know if I can assist you in the future.   Screening recommendations/referrals: Colonoscopy: Not a candidate for screening due to age Mammogram: 10/31/2021; due every 1-2 years Bone Density: no record Recommended yearly ophthalmology/optometry visit for glaucoma screening and checkup Recommended yearly dental visit for hygiene and checkup  Vaccinations: Influenza vaccine: 04/25/2021 Pneumococcal vaccine: 08/02/2021 Tdap vaccine: no record Shingles vaccine: no record   Covid-19: 07/04/2021, 11/28/2021  Advanced directives: Yes; Please bring a copy of your health care power of attorney and living will to the office at your convenience.  Conditions/risks identified: Yes  Next appointment: Please schedule your next Medicare Wellness Visit with your Nurse Health Advisor in 1 year by calling [phone].   Preventive Care 17 Years and Older, Female Preventive care refers to lifestyle choices and visits with your health care provider that can promote health and wellness. What does preventive care include? A yearly physical exam. This is also called an annual well check. Dental exams once or twice a year. Routine eye exams. Ask your health care provider how often you should have your eyes checked. Personal lifestyle choices, including: Daily care of your teeth and gums. Regular physical activity. Eating a healthy diet. Avoiding tobacco and drug use. Limiting alcohol use. Practicing safe sex. Taking low-dose aspirin every day. Taking vitamin and mineral supplements as recommended by your health care provider. What happens during an annual well check? The services and screenings done by your health care provider during your annual well check will depend on your age, overall health, lifestyle risk  factors, and family history of disease. Counseling  Your health care provider may ask you questions about your: Alcohol use. Tobacco use. Drug use. Emotional well-being. Home and relationship well-being. Sexual activity. Eating habits. History of falls. Memory and ability to understand (cognition). Work and work Statistician. Reproductive health. Screening  You may have the following tests or measurements: Height, weight, and BMI. Blood pressure. Lipid and cholesterol levels. These may be checked every 5 years, or more frequently if you are over 67 years old. Skin check. Lung cancer screening. You may have this screening every year starting at age 77 if you have a 30-pack-year history of smoking and currently smoke or have quit within the past 15 years. Fecal occult blood test (FOBT) of the stool. You may have this test every year starting at age 46. Flexible sigmoidoscopy or colonoscopy. You may have a sigmoidoscopy every 5 years or a colonoscopy every 10 years starting at age 11. Hepatitis C blood test. Hepatitis B blood test. Sexually transmitted disease (STD) testing. Diabetes screening. This is done by checking your blood sugar (glucose) after you have not eaten for a while (fasting). You may have this done every 1-3 years. Bone density scan. This is done to screen for osteoporosis. You may have this done starting at age 60. Mammogram. This may be done every 1-2 years. Talk to your health care provider about how often you should have regular mammograms. Talk with your health care provider about your test results, treatment options, and if necessary, the need for more tests. Vaccines  Your health care provider may recommend certain vaccines, such as: Influenza vaccine. This is recommended every year. Tetanus, diphtheria, and acellular pertussis (Tdap, Td) vaccine. You may need a Td booster every 10 years. Zoster  vaccine. You may need this after age 57. Pneumococcal 13-valent  conjugate (PCV13) vaccine. One dose is recommended after age 61. Pneumococcal polysaccharide (PPSV23) vaccine. One dose is recommended after age 83. Talk to your health care provider about which screenings and vaccines you need and how often you need them. This information is not intended to replace advice given to you by your health care provider. Make sure you discuss any questions you have with your health care provider. Document Released: 08/19/2015 Document Revised: 04/11/2016 Document Reviewed: 05/24/2015 Elsevier Interactive Patient Education  2017 [place] Prevention in the Home Falls can cause injuries. They can happen to people of all ages. There are many things you can do to make your home safe and to help prevent falls. What can I do on the outside of my home? Regularly fix the edges of walkways and driveways and fix any cracks. Remove anything that might make you trip as you walk through a door, such as a raised step or threshold. Trim any bushes or trees on the path to your home. Use bright outdoor lighting. Clear any walking paths of anything that might make someone trip, such as rocks or tools. Regularly check to see if handrails are loose or broken. Make sure that both sides of any steps have handrails. Any raised decks and porches should have guardrails on the edges. Have any leaves, snow, or ice cleared regularly. Use sand or salt on walking paths during winter. Clean up any spills in your garage right away. This includes oil or grease spills. What can I do in the bathroom? Use night lights. Install grab bars by the toilet and in the tub and shower. Do not use towel bars as grab bars. Use non-skid mats or decals in the tub or shower. If you need to sit down in the shower, use a plastic, non-slip stool. Keep the floor dry. Clean up any water that spills on the floor as soon as it happens. Remove soap buildup in the tub or shower regularly. Attach bath mats  securely with double-sided non-slip rug tape. Do not have throw rugs and other things on the floor that can make you trip. What can I do in the bedroom? Use night lights. Make sure that you have a light by your bed that is easy to reach. Do not use any sheets or blankets that are too big for your bed. They should not hang down onto the floor. Have a firm chair that has side arms. You can use this for support while you get dressed. Do not have throw rugs and other things on the floor that can make you trip. What can I do in the kitchen? Clean up any spills right away. Avoid walking on wet floors. Keep items that you use a lot in easy-to-reach places. If you need to reach something above you, use a strong step stool that has a grab bar. Keep electrical cords out of the way. Do not use floor polish or wax that makes floors slippery. If you must use wax, use non-skid floor wax. Do not have throw rugs and other things on the floor that can make you trip. What can I do with my stairs? Do not leave any items on the stairs. Make sure that there are handrails on both sides of the stairs and use them. Fix handrails that are broken or loose. Make sure that handrails are as long as the stairways. Check any carpeting to make sure that it  is firmly attached to the stairs. Fix any carpet that is loose or worn. Avoid having throw rugs at the top or bottom of the stairs. If you do have throw rugs, attach them to the floor with carpet tape. Make sure that you have a light switch at the top of the stairs and the bottom of the stairs. If you do not have them, ask someone to add them for you. What else can I do to help prevent falls? Wear shoes that: Do not have high heels. Have rubber bottoms. Are comfortable and fit you well. Are closed at the toe. Do not wear sandals. If you use a stepladder: Make sure that it is fully opened. Do not climb a closed stepladder. Make sure that both sides of the stepladder  are locked into place. Ask someone to hold it for you, if possible. Clearly mark and make sure that you can see: Any grab bars or handrails. First and last steps. Where the edge of each step is. Use tools that help you move around (mobility aids) if they are needed. These include: Canes. Walkers. Scooters. Crutches. Turn on the lights when you go into a dark area. Replace any light bulbs as soon as they burn out. Set up your furniture so you have a clear path. Avoid moving your furniture around. If any of your floors are uneven, fix them. If there are any pets around you, be aware of where they are. Review your medicines with your doctor. Some medicines can make you feel dizzy. This can increase your chance of falling. Ask your doctor what other things that you can do to help prevent falls. This information is not intended to replace advice given to you by your health care provider. Make sure you discuss any questions you have with your health care provider. Document Released: 05/19/2009 Document Revised: 12/29/2015 Document Reviewed: 08/27/2014 Elsevier Interactive Patient Education  2017 [employer].

## 2022-01-15 ENCOUNTER — Ambulatory Visit: Payer: Medicare Other | Admitting: Emergency Medicine

## 2022-01-15 ENCOUNTER — Encounter: Payer: Self-pay | Admitting: Emergency Medicine

## 2022-01-15 ENCOUNTER — Ambulatory Visit (INDEPENDENT_AMBULATORY_CARE_PROVIDER_SITE_OTHER): Payer: Medicare Other | Admitting: Emergency Medicine

## 2022-01-15 VITALS — BP 132/80 | HR 90 | Temp 97.6°F | Ht 65.0 in | Wt 184.5 lb

## 2022-01-15 DIAGNOSIS — Z8739 Personal history of other diseases of the musculoskeletal system and connective tissue: Secondary | ICD-10-CM | POA: Diagnosis not present

## 2022-01-15 DIAGNOSIS — Z23 Encounter for immunization: Secondary | ICD-10-CM

## 2022-01-15 DIAGNOSIS — K439 Ventral hernia without obstruction or gangrene: Secondary | ICD-10-CM | POA: Diagnosis not present

## 2022-01-15 DIAGNOSIS — Z9081 Acquired absence of spleen: Secondary | ICD-10-CM | POA: Diagnosis not present

## 2022-01-15 NOTE — Assessment & Plan Note (Signed)
Chronic without complications. Not affecting quality of life. Has been evaluated by general surgeon in the past. Recommendation is nonsurgical. ED precautions given

## 2022-01-15 NOTE — Progress Notes (Signed)
Nancy Duncan 81 y.o.   Chief Complaint  Patient presents with   Follow-up    HISTORY OF PRESENT ILLNESS: This is a 81 y.o. female here for follow-up of chronic medical problems. Had to go to the emergency department on 10/22/2021 down in [place] due to near syncope and dizziness.  Normal blood work. Had carotid ultrasounds and MRIs/MRA of brain and neck. Negative work-up. Released with prescription for meclizine.  Doing well.  No complications or related problems. Has a couple questions today: 1.  Questions regarding shingles vaccine #2 history of osteoporosis on Fosamax #3 chronic sleeping problems.  Question regarding safety of Benadryl #4 question regarding her ventral hernia No other complaints or medical concerns today.  HPI   Prior to Admission medications   Medication Sig Start Date End Date Taking? Authorizing Provider  alendronate (FOSAMAX) 70 MG tablet Take 1 tablet (70 mg total) by mouth once a week. 08/14/21  Yes Horald Pollen, MD  ASPIRIN 81 PO Take by mouth.   Yes [provider]  citalopram (CELEXA) 10 MG tablet Take 1 tablet (10 mg total) by mouth daily. 08/14/21  Yes Tayo Maute, Ines Bloomer, MD  atorvastatin (LIPITOR) 20 MG tablet Take 1 tablet (20 mg total) by mouth daily. Patient taking differently: Take 20 mg by mouth daily. 1/2 tablet every other day 08/14/21   Horald Pollen, MD  hydrOXYzine (ATARAX) 25 MG tablet Take 1 tablet (25 mg total) by mouth at bedtime as needed. 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   TIA (transient ischemic attack) 06/15/2021    Past Medical History:  Diagnosis Date   Cancer [facility])    Breast Cancer   Pancreatic tumor     Past Surgical History:  Procedure Laterality Date   APPENDECTOMY     SPLENECTOMY, TOTAL      Social History   Socioeconomic History   Marital status: Widowed    Spouse name: Not on file   Number of children: 3   Years of education: Not on file   Highest education level: Master's degree (e.g., MA, MS, MEng, MEd, MSW, MBA)  Occupational History   Not on file  Tobacco Use   Smoking status: Former    Types: Cigarettes   Smokeless tobacco: Never  Substance and Sexual Activity   Alcohol use: Yes    Comment: rare   Drug use: Never   Sexual activity: Not on file  Other Topics Concern   Not on file  Social History Narrative   Right handed    Caffeine  cup per day   Lives at home alone    Social Determinants of Health   Financial Resource Strain: Low Risk  (01/12/2022)   Overall Financial Resource Strain (CARDIA)    Difficulty of Paying Living Expenses: Not hard at all  Food Insecurity: No Food Insecurity (01/12/2022)   Hunger Vital Sign    Worried About Running Out of Food in the Last Year: Never true    Luna in the Last  Year: Never true  Transportation Needs: No Transportation Needs (01/12/2022)   PRAPARE - Hydrologist (Medical): No    Lack of Transportation (Non-Medical): No  Physical Activity: Sufficiently Active (01/12/2022)   Exercise Vital Sign    Days of Exercise per Week: 5 days    Minutes of Exercise per Session: 30 min  Stress: No Stress Concern Present (01/12/2022)   [place]    Feeling of Stress : Not at all  Social Connections: [place] (01/12/2022)   Social Connection and Isolation Panel [NHANES]    Frequency of Communication with Friends and  Family: More than three times a week    Frequency of Social Gatherings with Friends and Family: More than three times a week    Attends Religious Services: More than 4 times per year    Active Member of [employer] or Organizations: Yes    Attends Music therapist: More than 4 times per year    Marital Status: Married  Human resources officer Violence: Not At Risk (01/12/2022)   Humiliation, Afraid, Rape, and Kick questionnaire    Fear of Current or Ex-Partner: No    Emotionally Abused: No    Physically Abused: No    Sexually Abused: No    Family History  Problem Relation Age of Onset   Cancer - Ovarian Mother    Arthritis Father    Alzheimer's disease Sister    Arthritis Sister      Review of Systems  Constitutional: Negative.  Negative for chills and fever.  HENT: Negative.  Negative for congestion and sore throat.   Respiratory: Negative.  Negative for cough and shortness of breath.   Cardiovascular: Negative.  Negative for chest pain and palpitations.  Gastrointestinal:  Negative for abdominal pain, diarrhea, nausea and vomiting.  Genitourinary: Negative.   Skin: Negative.  Negative for rash.  Neurological:  Negative for dizziness and headaches.  All other systems reviewed and are negative.  Today's Vitals   01/15/22 0925  BP: 132/80  Pulse: 90  Temp: 97.6 F (36.4 C)  TempSrc: Oral  SpO2: 99%  Weight: 184 lb 8 oz (83.7 kg)  Height: '5\' 5"'$  (1.651 m)   Body mass index is 30.7 kg/m.   Physical Exam Vitals reviewed.  Constitutional:      Appearance: Normal appearance.  HENT:     Head: Normocephalic.     Mouth/Throat:     Mouth: Mucous membranes are moist.     Pharynx: Oropharynx is clear.  Eyes:     Extraocular Movements: Extraocular movements intact.     Conjunctiva/sclera: Conjunctivae normal.     Pupils: Pupils are equal, round, and reactive to light.  Cardiovascular:     Rate and Rhythm: Normal rate and regular rhythm.     Pulses: Normal pulses.      Heart sounds: Normal heart sounds.  Pulmonary:     Effort: Pulmonary effort is normal.     Breath sounds: Normal breath sounds.  Abdominal:     General: There is no distension.     Palpations: Abdomen is soft.     Tenderness: There is no abdominal tenderness.     Hernia: A hernia (Large ventral hernia.  No tenderness.) is present.  Musculoskeletal:     Cervical back: No tenderness.     Right lower leg: No edema.     Left lower leg: No edema.  Lymphadenopathy:     Cervical: No cervical  adenopathy.  Skin:    General: Skin is warm and dry.     Capillary Refill: Capillary refill takes less than 2 seconds.  Neurological:     General: No focal deficit present.     Mental Status: She is alert and oriented to person, place, and time.  Psychiatric:        Mood and Affect: Mood normal.        Behavior: Behavior normal.      ASSESSMENT & PLAN: Problem List Items Addressed This Visit       Other   History of splenectomy   Ventral hernia without obstruction or gangrene - Primary    Chronic without complications. Not affecting quality of life. Has been evaluated by general surgeon in the past. Recommendation is nonsurgical. ED precautions given      History of osteoporosis    Stable.  On weekly Fosamax 70 mg.      Other Visit Diagnoses     Need for vaccination       Relevant Orders   Tdap vaccine greater than or equal to 7yo IM      Patient Instructions  Health Maintenance After Age 68 After age 39, you are at a higher risk for certain long-term diseases and infections as well as injuries from falls. Falls are a major cause of broken bones and head injuries in people who are older than age 58. Getting regular preventive care can help to keep you healthy and well. Preventive care includes getting regular testing and making lifestyle changes as recommended by your health care provider. Talk with your health care provider about: Which screenings and tests you should have. A  screening is a test that checks for a disease when you have no symptoms. A diet and exercise plan that is right for you. What should I know about screenings and tests to prevent falls? Screening and testing are the best ways to find a health problem early. Early diagnosis and treatment give you the best chance of managing medical conditions that are common after age 32. Certain conditions and lifestyle choices may make you more likely to have a fall. Your health care provider may recommend: Regular vision checks. Poor vision and conditions such as cataracts can make you more likely to have a fall. If you wear glasses, make sure to get your prescription updated if your vision changes. Medicine review. Work with your health care provider to regularly review all of the medicines you are taking, including over-the-counter medicines. Ask your health care provider about any side effects that may make you more likely to have a fall. Tell your health care provider if any medicines that you take make you feel dizzy or sleepy. Strength and balance checks. Your health care provider may recommend certain tests to check your strength and balance while standing, walking, or changing positions. Foot health exam. Foot pain and numbness, as well as not wearing proper footwear, can make you more likely to have a fall. Screenings, including: Osteoporosis screening. Osteoporosis is a condition that causes the bones to get weaker and break more easily. Blood pressure screening. Blood pressure changes and medicines to control blood pressure can make you feel dizzy. Depression screening. You may be more likely to have a fall if you have a fear of falling, feel depressed, or feel unable to do activities that you used to do. Alcohol use screening. Using too much alcohol can affect your balance and may make you more likely to have a  fall. Follow these instructions at home: Lifestyle Do not drink alcohol if: Your health care  provider tells you not to drink. If you drink alcohol: Limit how much you have to: 0-1 drink a day for women. 0-2 drinks a day for men. Know how much alcohol is in your drink. In the [place] one drink equals one 12 oz bottle of beer (355 mL), one 5 oz glass of wine (148 mL), or one 1 oz glass of hard liquor (44 mL). Do not use any products that contain nicotine or tobacco. These products include cigarettes, chewing tobacco, and vaping devices, such as e-cigarettes. If you need help quitting, ask your health care provider. Activity  Follow a regular exercise program to stay fit. This will help you maintain your balance. Ask your health care provider what types of exercise are appropriate for you. If you need a cane or walker, use it as recommended by your health care provider. Wear supportive shoes that have nonskid soles. Safety  Remove any tripping hazards, such as rugs, cords, and clutter. Install safety equipment such as grab bars in bathrooms and safety rails on stairs. Keep rooms and walkways well-lit. General instructions Talk with your health care provider about your risks for falling. Tell your health care provider if: You fall. Be sure to tell your health care provider about all falls, even ones that seem minor. You feel dizzy, tiredness (fatigue), or off-balance. Take over-the-counter and prescription medicines only as told by your health care provider. These include supplements. Eat a healthy diet and maintain a healthy weight. A healthy diet includes low-fat dairy products, low-fat (lean) meats, and fiber from whole grains, beans, and lots of fruits and vegetables. Stay current with your vaccines. Schedule regular health, dental, and eye exams. Summary Having a healthy lifestyle and getting preventive care can help to protect your health and wellness after age 85. Screening and testing are the best way to find a health problem early and help you avoid having a fall. Early  diagnosis and treatment give you the best chance for managing medical conditions that are more common for people who are older than age 62. Falls are a major cause of broken bones and head injuries in people who are older than age 27. Take precautions to prevent a fall at home. Work with your health care provider to learn what changes you can make to improve your health and wellness and to prevent falls. This information is not intended to replace advice given to you by your health care provider. Make sure you discuss any questions you have with your health care provider. Document Revised: 12/12/2020 Document Reviewed: 12/12/2020 Elsevier Patient Education  [place], MD [facility]

## 2022-01-15 NOTE — Assessment & Plan Note (Signed)
Stable.  On weekly Fosamax 70 mg.

## 2022-01-15 NOTE — Patient Instructions (Signed)
Health Maintenance After Age 83 After age 83, you are at a higher risk for certain long-term diseases and infections as well as injuries from falls. Falls are a major cause of broken bones and head injuries in people who are older than age 83. Getting regular preventive care can help to keep you healthy and well. Preventive care includes getting regular testing and making lifestyle changes as recommended by your health care provider. Talk with your health care provider about: Which screenings and tests you should have. A screening is a test that checks for a disease when you have no symptoms. A diet and exercise plan that is right for you. What should I know about screenings and tests to prevent falls? Screening and testing are the best ways to find a health problem early. Early diagnosis and treatment give you the best chance of managing medical conditions that are common after age 83. Certain conditions and lifestyle choices may make you more likely to have a fall. Your health care provider may recommend: Regular vision checks. Poor vision and conditions such as cataracts can make you more likely to have a fall. If you wear glasses, make sure to get your prescription updated if your vision changes. Medicine review. Work with your health care provider to regularly review all of the medicines you are taking, including over-the-counter medicines. Ask your health care provider about any side effects that may make you more likely to have a fall. Tell your health care provider if any medicines that you take make you feel dizzy or sleepy. Strength and balance checks. Your health care provider may recommend certain tests to check your strength and balance while standing, walking, or changing positions. Foot health exam. Foot pain and numbness, as well as not wearing proper footwear, can make you more likely to have a fall. Screenings, including: Osteoporosis screening. Osteoporosis is a condition that causes  the bones to get weaker and break more easily. Blood pressure screening. Blood pressure changes and medicines to control blood pressure can make you feel dizzy. Depression screening. You may be more likely to have a fall if you have a fear of falling, feel depressed, or feel unable to do activities that you used to do. Alcohol use screening. Using too much alcohol can affect your balance and may make you more likely to have a fall. Follow these instructions at home: Lifestyle Do not drink alcohol if: Your health care provider tells you not to drink. If you drink alcohol: Limit how much you have to: 0-1 drink a day for women. 0-2 drinks a day for men. Know how much alcohol is in your drink. In the [place] one drink equals one 12 oz bottle of beer (355 mL), one 5 oz glass of wine (148 mL), or one 1 oz glass of hard liquor (44 mL). Do not use any products that contain nicotine or tobacco. These products include cigarettes, chewing tobacco, and vaping devices, such as e-cigarettes. If you need help quitting, ask your health care provider. Activity  Follow a regular exercise program to stay fit. This will help you maintain your balance. Ask your health care provider what types of exercise are appropriate for you. If you need a cane or walker, use it as recommended by your health care provider. Wear supportive shoes that have nonskid soles. Safety  Remove any tripping hazards, such as rugs, cords, and clutter. Install safety equipment such as grab bars in bathrooms and safety rails on stairs. Keep rooms and walkways   well-lit. General instructions Talk with your health care provider about your risks for falling. Tell your health care provider if: You fall. Be sure to tell your health care provider about all falls, even ones that seem minor. You feel dizzy, tiredness (fatigue), or off-balance. Take over-the-counter and prescription medicines only as told by your health care provider. These include  supplements. Eat a healthy diet and maintain a healthy weight. A healthy diet includes low-fat dairy products, low-fat (lean) meats, and fiber from whole grains, beans, and lots of fruits and vegetables. Stay current with your vaccines. Schedule regular health, dental, and eye exams. Summary Having a healthy lifestyle and getting preventive care can help to protect your health and wellness after age 83. Screening and testing are the best way to find a health problem early and help you avoid having a fall. Early diagnosis and treatment give you the best chance for managing medical conditions that are more common for people who are older than age 83. Falls are a major cause of broken bones and head injuries in people who are older than age 83. Take precautions to prevent a fall at home. Work with your health care provider to learn what changes you can make to improve your health and wellness and to prevent falls. This information is not intended to replace advice given to you by your health care provider. Make sure you discuss any questions you have with your health care provider. Document Revised: 12/12/2020 Document Reviewed: 12/12/2020 Elsevier Patient Education  2023 Elsevier Inc.  

## 2022-01-17 ENCOUNTER — Ambulatory Visit: Payer: Medicare Other | Admitting: Emergency Medicine

## 2022-01-31 ENCOUNTER — Ambulatory Visit: Payer: Medicare Other | Admitting: Emergency Medicine

## 2022-03-26 DIAGNOSIS — Z8507 Personal history of malignant neoplasm of pancreas: Secondary | ICD-10-CM | POA: Diagnosis not present

## 2022-03-26 DIAGNOSIS — K439 Ventral hernia without obstruction or gangrene: Secondary | ICD-10-CM | POA: Diagnosis not present

## 2022-03-26 DIAGNOSIS — Z79899 Other long term (current) drug therapy: Secondary | ICD-10-CM | POA: Diagnosis not present

## 2022-03-26 DIAGNOSIS — Z90410 Acquired total absence of pancreas: Secondary | ICD-10-CM | POA: Diagnosis not present

## 2022-03-26 DIAGNOSIS — Z08 Encounter for follow-up examination after completed treatment for malignant neoplasm: Secondary | ICD-10-CM | POA: Diagnosis not present

## 2022-03-26 DIAGNOSIS — Z87891 Personal history of nicotine dependence: Secondary | ICD-10-CM | POA: Diagnosis not present

## 2022-03-26 DIAGNOSIS — C7A8 Other malignant neuroendocrine tumors: Secondary | ICD-10-CM | POA: Diagnosis not present

## 2022-03-26 DIAGNOSIS — Z853 Personal history of malignant neoplasm of breast: Secondary | ICD-10-CM | POA: Diagnosis not present

## 2022-03-26 DIAGNOSIS — Z9081 Acquired absence of spleen: Secondary | ICD-10-CM | POA: Diagnosis not present

## 2022-05-07 ENCOUNTER — Other Ambulatory Visit: Payer: Self-pay | Admitting: Surgery

## 2022-05-07 DIAGNOSIS — K432 Incisional hernia without obstruction or gangrene: Secondary | ICD-10-CM | POA: Diagnosis not present

## 2022-05-09 DIAGNOSIS — G5632 Lesion of radial nerve, left upper limb: Secondary | ICD-10-CM | POA: Diagnosis not present

## 2022-05-09 DIAGNOSIS — M79642 Pain in left hand: Secondary | ICD-10-CM | POA: Diagnosis not present

## 2022-05-09 DIAGNOSIS — R200 Anesthesia of skin: Secondary | ICD-10-CM | POA: Diagnosis not present

## 2022-05-16 DIAGNOSIS — H353132 Nonexudative age-related macular degeneration, bilateral, intermediate dry stage: Secondary | ICD-10-CM | POA: Diagnosis not present

## 2022-05-16 DIAGNOSIS — H401132 Primary open-angle glaucoma, bilateral, moderate stage: Secondary | ICD-10-CM | POA: Diagnosis not present

## 2022-05-16 DIAGNOSIS — H401121 Primary open-angle glaucoma, left eye, mild stage: Secondary | ICD-10-CM | POA: Diagnosis not present

## 2022-05-16 DIAGNOSIS — H401112 Primary open-angle glaucoma, right eye, moderate stage: Secondary | ICD-10-CM | POA: Diagnosis not present

## 2022-05-17 ENCOUNTER — Telehealth: Payer: Self-pay | Admitting: *Deleted

## 2022-05-17 NOTE — Telephone Encounter (Signed)
[facility] call nurse called and states they did a [employer] test was abnormal And the patient ABI right was .64 and the left was .69. Patient is not having any symptoms of peripheral vascular disease.

## 2022-05-17 NOTE — Telephone Encounter (Signed)
Who ordered this test?

## 2022-05-21 NOTE — Telephone Encounter (Signed)
Okay.  Thanks.  We will address this during her next scheduled office visit.  Thanks.

## 2022-06-14 ENCOUNTER — Other Ambulatory Visit: Payer: Self-pay | Admitting: Emergency Medicine

## 2022-07-17 ENCOUNTER — Ambulatory Visit: Payer: Medicare Other | Admitting: Emergency Medicine

## 2022-07-24 ENCOUNTER — Ambulatory Visit: Payer: Medicare Other | Admitting: Emergency Medicine

## 2022-07-25 ENCOUNTER — Encounter: Payer: Self-pay | Admitting: Emergency Medicine

## 2022-07-25 ENCOUNTER — Ambulatory Visit (INDEPENDENT_AMBULATORY_CARE_PROVIDER_SITE_OTHER): Payer: Medicare Other | Admitting: Emergency Medicine

## 2022-07-25 VITALS — BP 136/72 | HR 89 | Temp 97.8°F | Ht 65.0 in | Wt 184.2 lb

## 2022-07-25 DIAGNOSIS — H6121 Impacted cerumen, right ear: Secondary | ICD-10-CM | POA: Diagnosis not present

## 2022-07-25 DIAGNOSIS — I739 Peripheral vascular disease, unspecified: Secondary | ICD-10-CM | POA: Diagnosis not present

## 2022-07-25 NOTE — Progress Notes (Signed)
Nancy Duncan 81 y.o.   Chief Complaint  Patient presents with   Follow-up    39mth f/u appt , numbness in her feet, progressively getting worse, right ear clogged     HISTORY OF PRESENT ILLNESS: This is a 81y.o. female complaining of numbness to both feet 4 weeks.  Recent circulation test done by visiting nurse showed findings consistent with peripheral artery disease. Recent flulike symptoms that started 1 week ago and now are much better.  Still has some lingering cough.  HPI   Prior to Admission medications   Medication Sig Start Date End Date Taking? Authorizing Provider  alendronate (FOSAMAX) 70 MG tablet Take 1 tablet (70 mg total) by mouth once a week. 08/14/21  Yes SHorald Pollen MD  ASPIRIN 81 PO Take by mouth.   Yes [provider]  atorvastatin (LIPITOR) 20 MG tablet Take 1 tablet (20 mg total) by mouth daily. Patient taking differently: Take 20 mg by mouth daily. 1/2 tablet every other day 08/14/21  Yes Jermaine Tholl, MInes Bloomer MD  citalopram (CELEXA) 10 MG tablet TAKE ONE TABLET BY MOUTH DAILY 06/14/22  Yes Ryenn Howeth, MInes Bloomer MD  hydrOXYzine (ATARAX) 25 MG tablet Take 1 tablet (25 mg total) by mouth at bedtime as needed. 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   Bilateral carpal tunnel syndrome 08/16/2021   History of TIA (transient ischemic attack) 08/02/2021   Pancreatic tumor 08/02/2021   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   Ventral hernia without obstruction or gangrene 08/02/2021   TIA (transient ischemic attack) 06/15/2021    Past Medical History:  Diagnosis Date   Cancer ([facility]    Breast Cancer   Pancreatic tumor     Past Surgical History:  Procedure Laterality Date   APPENDECTOMY     SPLENECTOMY, TOTAL      Social History    Socioeconomic History   Marital status: Widowed    Spouse name: Not on file   Number of children: 3   Years of education: Not on file   Highest education level: Master's degree (e.g., MA, MS, MEng, MEd, MSW, MBA)  Occupational History   Not on file  Tobacco Use   Smoking status: Former    Types: Cigarettes   Smokeless tobacco: Never  Substance and Sexual Activity   Alcohol use: Yes    Comment: rare   Drug use: Never   Sexual activity: Not on file  Other Topics Concern   Not on file  Social History Narrative   Right handed    Caffeine  cup per day   Lives at home alone    Social Determinants of Health   Financial Resource Strain: Low Risk  (01/12/2022)   Overall Financial Resource Strain (CARDIA)    Difficulty of Paying Living Expenses: Not hard at all  Food Insecurity: No Food Insecurity (01/12/2022)   Hunger Vital Sign    Worried About RMoonshinein the Last Year: Never true    RMcLeanin the Last Year: Never true  Transportation Needs: No Transportation Needs (01/12/2022)   PRAPARE - THydrologist(Medical): No    Lack of Transportation (Non-Medical): No  Physical Activity: Sufficiently Active (01/12/2022)   Exercise Vital Sign    Days  of Exercise per Week: 5 days    Minutes of Exercise per Session: 30 min  Stress: No Stress Concern Present (01/12/2022)   [place]    Feeling of Stress : Not at all  Social Connections: [place] (01/12/2022)   Social Connection and Isolation Panel [NHANES]    Frequency of Communication with Friends and Family: More than three times a week    Frequency of Social Gatherings with Friends and Family: More than three times a week    Attends Religious Services: More than 4 times per year    Active Member of [employer] or Organizations: Yes    Attends Music therapist: More than 4 times per year    Marital Status:  Married  Human resources officer Violence: Not At Risk (01/12/2022)   Humiliation, Afraid, Rape, and Kick questionnaire    Fear of Current or Ex-Partner: No    Emotionally Abused: No    Physically Abused: No    Sexually Abused: No    Family History  Problem Relation Age of Onset   Cancer - Ovarian Mother    Arthritis Father    Alzheimer's disease Sister    Arthritis Sister      Review of Systems  Constitutional: Negative.  Negative for chills and fever.  HENT:  Positive for congestion.   Respiratory:  Positive for cough.   Cardiovascular: Negative.  Negative for chest pain and palpitations.  Gastrointestinal:  Negative for abdominal pain, diarrhea, nausea and vomiting.  Genitourinary: Negative.  Negative for dysuria and hematuria.  Neurological: Negative.  Negative for dizziness and headaches.   Today's Vitals   07/25/22 1435  BP: 136/72  Pulse: 89  Temp: 97.8 F (36.6 C)  TempSrc: Oral  SpO2: 97%  Weight: 184 lb 4 oz (83.6 kg)  Height: '5\' 5"'$  (1.651 m)   Body mass index is 30.66 kg/m.   Physical Exam Vitals reviewed.  Constitutional:      Appearance: Normal appearance.  HENT:     Right Ear: There is impacted cerumen.     Left Ear: Tympanic membrane, ear canal and external ear normal.     Mouth/Throat:     Mouth: Mucous membranes are moist.     Pharynx: Oropharynx is clear.  Eyes:     Extraocular Movements: Extraocular movements intact.     Pupils: Pupils are equal, round, and reactive to light.  Cardiovascular:     Rate and Rhythm: Normal rate and regular rhythm.     Pulses: Normal pulses.     Heart sounds: Normal heart sounds.  Pulmonary:     Effort: Pulmonary effort is normal.     Breath sounds: Normal breath sounds.  Skin:    Comments: Lower extremities: Warm to touch.  No erythema or ecchymosis.  No signs of chronic venous insufficiency.  Palpable distal pulses.  Good intact distal sensation  Neurological:     General: No focal deficit present.     Mental  Status: She is alert and oriented to person, place, and time.  Psychiatric:        Mood and Affect: Mood normal.        Behavior: Behavior normal.      ASSESSMENT & PLAN: A total of 42 minutes was spent with the patient and counseling/coordination of care regarding preparing for this visit, review of most recent office visit notes, review of chronic medical problems under management, review of all medications, diagnosis of peripheral artery disease  and need for vascular surgery evaluation, management, prognosis, documentation and need for follow-up.  Problem List Items Addressed This Visit       Cardiovascular and Mediastinum   Peripheral artery disease ([place]) - Primary    Circulation screening QuantaFlo left foot 0.69 Circulation screening QuantaFlo right foot: 0.64 Clinically complaining of numbness to both feet No significant clinical findings Recommend vascular surgery evaluation Referral placed today.      Relevant Orders   Ambulatory referral to Vascular Surgery     Nervous and Auditory   Hearing loss due to cerumen impaction, right    Right ear irrigated with partial results No complications.  Tolerated procedure well.      Relevant Orders   Ear Lavage   Patient Instructions  Peripheral Vascular Disease  Peripheral vascular disease (PVD) is a disease of the blood vessels that carry blood from the heart to the rest of the body. PVD is also called peripheral artery disease (PAD) or poor circulation. PVD affects most of the body. But it affects the legs and feet the most. PVD can lead to acute limb ischemia. This happens when there is a sudden stop of blood flow to an arm or leg. This is a medical emergency. What are the causes? The most common cause of PVD is a buildup of a fatty substance (plaque) inside your arteries. This decreases blood flow. Plaque can break off and block blood in a smaller artery. This can lead to acute limb ischemia. Other common causes of PVD  include: Blood clots inside the blood vessels. Injuries to blood vessels. Irritation and swelling of blood vessels. Sudden tightening of the blood vessel (spasms). What increases the risk? A family history of PVD. Medical conditions, including: High cholesterol. Diabetes. High blood pressure. Heart disease. Past problems with blood clots. Past injury, such as burns or a broken bone. Other conditions, such as: Buerger's disease. This is caused by swollen or irritated blood vessels in your hands and feet. Arthritis. Birth defects that affect the arteries in your legs. Kidney disease. Using tobacco or nicotine products. Not getting enough exercise. Being very overweight (obese). Being 60 years old or older. What are the signs or symptoms? Cramps in your butt, legs, and feet. Pain and weakness in your legs when you are active that goes away when you rest. Leg pain when at rest. Leg numbness, tingling, or weakness. Coldness in a leg or foot, especially when compared with the other leg or foot. Skin or hair changes. These can include: Hair loss. Shiny skin. Pale or bluish skin. Thick toenails. Being unable to get or keep an erection. Tiredness (fatigue). Weak pulse or no pulse in the feet. Wounds and sores on the toes, feet, or legs. These take longer to heal. How is this treated? Underlying causes are treated first. Other conditions, like diabetes, high cholesterol, and blood pressure, are also treated. Treatment may include: Lifestyle changes, such as: Quitting smoking. Getting regular exercise. Having a diet low in fat and cholesterol. Not drinking alcohol. Taking medicines, such as: Blood thinners. Medicines to improve blood flow. Medicines to improve your blood cholesterol. Procedures to: Open the arteries and restore blood flow. Insert a small mesh tube (stent) to keep a blocked vessel open. Create a new path for blood to flow to the body (peripheral  bypass). Remove dead tissue from a wound. Remove an affected leg or arm. Follow these instructions at home: Medicines Take over-the-counter and prescription medicines only as told by your doctor. If you  are taking blood thinners: Talk with your doctor before you take any medicines that have aspirin, or NSAIDs, such as ibuprofen. Take medicines exactly as told. Take them at the same time each day. Avoid doing things that could hurt or bruise you. Take action to prevent falls. Wear an alert bracelet or carry a card that shows you are taking blood thinners. Lifestyle     Get regular exercise. Ask your doctor about how to stay active. Talk with your doctor about keeping a healthy weight. If needed, ask about losing weight. Eat a diet that is low in fat and cholesterol. If you need help, talk with your doctor. Do not drink alcohol. Do not smoke or use any products that contain nicotine or tobacco. If you need help quitting, ask your doctor. General instructions Take good care of your feet. To do this: Wear shoes that fit well and feel good. Check your feet often for any cuts or sores. Get a flu shot (influenza vaccine) each year. Keep all follow-up visits. Where to find more information Society for Vascular Surgery: vascular.org American Heart Association: heart.org National Heart, Lung, and Blood Institute: https://www.[place]man-hill.biz/ Contact a doctor if: You have cramps in your legs when you walk. You have leg pain when you rest. Your leg or foot feels cold. Your skin changes. You cannot get or keep an erection. You have cuts or sores on your legs or feet that do not heal. Get help right away if: You have sudden changes in the color and feeling of your arms or legs, such as: Your arm or leg turns cold, numb, and blue. Your arm or leg becomes red, warm, swollen, painful, or numb. You have any signs of a stroke. "BE FAST" is an easy way to remember the main warning signs: B - Balance.  Dizziness, sudden trouble walking, or loss of balance. E - Eyes. Trouble seeing or a change in how you see. F - Face. Sudden weakness or loss of feeling of the face. The face or eyelid may droop on one side. A - Arms. Weakness or loss of feeling in an arm. This happens all of a sudden and most often on one side of the body. S - Speech. Sudden trouble speaking, slurred speech, or trouble understanding what people say. T - Time. Time to call emergency services. Write down what time symptoms started. You have other signs of a stroke, such as: A sudden, very bad headache with no known cause. Feeling like you may vomit (nausea). Vomiting. A seizure. You have chest pain or trouble breathing. These symptoms may be an emergency. Get help right away. Call your local emergency services (911 in the [place] Do not wait to see if the symptoms will go away. Do not drive yourself to the hospital. Summary Peripheral vascular disease (PVD) is a disease of the blood vessels. PVD affects the legs and feet the most. Symptoms may include leg pain or leg numbness, tingling, and weakness. Treatment may include lifestyle changes, medicines, and procedures. This information is not intended to replace advice given to you by your health care provider. Make sure you discuss any questions you have with your health care provider. Document Revised: 01/25/2020 Document Reviewed: 01/25/2020 Elsevier Patient Education  [place], [place] [facility]

## 2022-07-25 NOTE — Patient Instructions (Signed)
Peripheral Vascular Disease  Peripheral vascular disease (PVD) is a disease of the blood vessels that carry blood from the heart to the rest of the body. PVD is also called peripheral artery disease (PAD) or poor circulation. PVD affects most of the body. But it affects the legs and feet the most. PVD can lead to acute limb ischemia. This happens when there is a sudden stop of blood flow to an arm or leg. This is a medical emergency. What are the causes? The most common cause of PVD is a buildup of a fatty substance (plaque) inside your arteries. This decreases blood flow. Plaque can break off and block blood in a smaller artery. This can lead to acute limb ischemia. Other common causes of PVD include: Blood clots inside the blood vessels. Injuries to blood vessels. Irritation and swelling of blood vessels. Sudden tightening of the blood vessel (spasms). What increases the risk? A family history of PVD. Medical conditions, including: High cholesterol. Diabetes. High blood pressure. Heart disease. Past problems with blood clots. Past injury, such as burns or a broken bone. Other conditions, such as: Buerger's disease. This is caused by swollen or irritated blood vessels in your hands and feet. Arthritis. Birth defects that affect the arteries in your legs. Kidney disease. Using tobacco or nicotine products. Not getting enough exercise. Being very overweight (obese). Being 50 years old or older. What are the signs or symptoms? Cramps in your butt, legs, and feet. Pain and weakness in your legs when you are active that goes away when you rest. Leg pain when at rest. Leg numbness, tingling, or weakness. Coldness in a leg or foot, especially when compared with the other leg or foot. Skin or hair changes. These can include: Hair loss. Shiny skin. Pale or bluish skin. Thick toenails. Being unable to get or keep an erection. Tiredness (fatigue). Weak pulse or no pulse in the  feet. Wounds and sores on the toes, feet, or legs. These take longer to heal. How is this treated? Underlying causes are treated first. Other conditions, like diabetes, high cholesterol, and blood pressure, are also treated. Treatment may include: Lifestyle changes, such as: Quitting smoking. Getting regular exercise. Having a diet low in fat and cholesterol. Not drinking alcohol. Taking medicines, such as: Blood thinners. Medicines to improve blood flow. Medicines to improve your blood cholesterol. Procedures to: Open the arteries and restore blood flow. Insert a small mesh tube (stent) to keep a blocked vessel open. Create a new path for blood to flow to the body (peripheral bypass). Remove dead tissue from a wound. Remove an affected leg or arm. Follow these instructions at home: Medicines Take over-the-counter and prescription medicines only as told by your doctor. If you are taking blood thinners: Talk with your doctor before you take any medicines that have aspirin, or NSAIDs, such as ibuprofen. Take medicines exactly as told. Take them at the same time each day. Avoid doing things that could hurt or bruise you. Take action to prevent falls. Wear an alert bracelet or carry a card that shows you are taking blood thinners. Lifestyle     Get regular exercise. Ask your doctor about how to stay active. Talk with your doctor about keeping a healthy weight. If needed, ask about losing weight. Eat a diet that is low in fat and cholesterol. If you need help, talk with your doctor. Do not drink alcohol. Do not smoke or use any products that contain nicotine or tobacco. If you need help   quitting, ask your doctor. General instructions Take good care of your feet. To do this: Wear shoes that fit well and feel good. Check your feet often for any cuts or sores. Get a flu shot (influenza vaccine) each year. Keep all follow-up visits. Where to find more information Society for  Vascular Surgery: vascular.org American Heart Association: heart.org National Heart, Lung, and Blood Institute: nhlbi.nih.gov Contact a doctor if: You have cramps in your legs when you walk. You have leg pain when you rest. Your leg or foot feels cold. Your skin changes. You cannot get or keep an erection. You have cuts or sores on your legs or feet that do not heal. Get help right away if: You have sudden changes in the color and feeling of your arms or legs, such as: Your arm or leg turns cold, numb, and blue. Your arm or leg becomes red, warm, swollen, painful, or numb. You have any signs of a stroke. "BE FAST" is an easy way to remember the main warning signs: B - Balance. Dizziness, sudden trouble walking, or loss of balance. E - Eyes. Trouble seeing or a change in how you see. F - Face. Sudden weakness or loss of feeling of the face. The face or eyelid may droop on one side. A - Arms. Weakness or loss of feeling in an arm. This happens all of a sudden and most often on one side of the body. S - Speech. Sudden trouble speaking, slurred speech, or trouble understanding what people say. T - Time. Time to call emergency services. Write down what time symptoms started. You have other signs of a stroke, such as: A sudden, very bad headache with no known cause. Feeling like you may vomit (nausea). Vomiting. A seizure. You have chest pain or trouble breathing. These symptoms may be an emergency. Get help right away. Call your local emergency services (911 in the [place] Do not wait to see if the symptoms will go away. Do not drive yourself to the hospital. Summary Peripheral vascular disease (PVD) is a disease of the blood vessels. PVD affects the legs and feet the most. Symptoms may include leg pain or leg numbness, tingling, and weakness. Treatment may include lifestyle changes, medicines, and procedures. This information is not intended to replace advice given to you by your health  care provider. Make sure you discuss any questions you have with your health care provider. Document Revised: 01/25/2020 Document Reviewed: 01/25/2020 Elsevier Patient Education  2023 Elsevier Inc.  

## 2022-07-25 NOTE — Assessment & Plan Note (Signed)
Circulation screening QuantaFlo left foot 0.69 Circulation screening QuantaFlo right foot: 0.64 Clinically complaining of numbness to both feet No significant clinical findings Recommend vascular surgery evaluation Referral placed today.

## 2022-07-25 NOTE — Assessment & Plan Note (Signed)
Right ear irrigated with partial results No complications.  Tolerated procedure well.

## 2022-07-25 NOTE — Progress Notes (Signed)
PRE-PROCEDURE EXAM: Right TM cannot be visualized due to total occlusion/impaction of the ear canal.  PROCEDURE INDICATION: remove wax to visualize ear drum & relieve discomfort  CONSENT:  Verbal     PROCEDURE NOTE:     Right EAR:  I used warm water irrigation under direct visualization with the otoscope to free the wax bolus from the ear canal.    POST- PROCEDURE EXAM:  Right TM unsuccessfully visualized and found to have erythema and irritation. Stopped irrigation and notified the provider      The patient tolerated the procedure well.

## 2022-08-02 ENCOUNTER — Other Ambulatory Visit: Payer: Self-pay | Admitting: *Deleted

## 2022-08-02 ENCOUNTER — Ambulatory Visit (HOSPITAL_COMMUNITY)
Admission: RE | Admit: 2022-08-02 | Discharge: 2022-08-02 | Disposition: A | Discharge status: Home / Self Care | Payer: Medicare Other | Source: Ambulatory Visit | Attending: Vascular Surgery | Admitting: Vascular Surgery

## 2022-08-02 DIAGNOSIS — I739 Peripheral vascular disease, unspecified: Secondary | ICD-10-CM | POA: Diagnosis not present

## 2022-08-04 DIAGNOSIS — R42 Dizziness and giddiness: Secondary | ICD-10-CM | POA: Diagnosis not present

## 2022-08-06 NOTE — Progress Notes (Unsigned)
[facility] [place]  ASSESSMENT / PLAN: Nancy Duncan is a 82 y.o. female with atherosclerosis of *** native arteries of *** causing {Chronic PAD levels:25303}.  Patient counseled {pad risk2:26283}  WIfI score calculated based on clinical exam and non-invasive measurements. {WIFIvascular:26096}  Recommend the following which can slow the progression of atherosclerosis and reduce the risk of major adverse cardiac / limb events:  Complete cessation from all tobacco products. Blood glucose control with goal A1c < 7%. Blood pressure control with goal blood pressure < 140/90 mmHg. Lipid reduction therapy with goal LDL-C <100 mg/dL (<70 if symptomatic from PAD).  Aspirin '81mg'$  PO QD.  *** Clopidogrel '75mg'$  PO QD. *** Rivaroxaban 2.'5mg'$  PO BID. *** Cilostozal '100mg'$  PO BID for intermittent claudication without evidence of heart failure. Atorvastatin 40-'80mg'$  PO QD (or other "high intensity" statin therapy). *** Daily walking to and past the point of discomfort. Patient counseled to keep a log of exercise distance. *** Adequate hydration (at least 2 liters / day) if patient's heart and kidney function is adequate.  Plan *** lower extremity angiogram with possible intervention via *** approach in cath lab ***.    CHIEF COMPLAINT: ***  HISTORY OF PRESENT ILLNESS: Nancy Duncan is a 82 y.o. female ***  VASCULAR SURGICAL HISTORY: ***  VASCULAR RISK FACTORS: {FINDINGS; POSITIVE NEGATIVE:[id number]} history of stroke / transient ischemic attack. {FINDINGS; POSITIVE NEGATIVE:[id number]} history of coronary artery disease. *** history of PCI. *** history of CABG.  {FINDINGS; POSITIVE NEGATIVE:[id number]} history of diabetes mellitus. Last A1c ***. {FINDINGS; POSITIVE NEGATIVE:[id number]} history of smoking. *** actively smoking. {FINDINGS; POSITIVE NEGATIVE:[id number]} history of hypertension. *** drug regimen with *** control. {FINDINGS; POSITIVE NEGATIVE:[id number]} history of  chronic kidney disease.  Last GFR ***. CKD {stage:30421363}. {FINDINGS; POSITIVE NEGATIVE:[phone]} history of chronic obstructive pulmonary disease, treated with ***.  FUNCTIONAL STATUS: ECOG performance status: {findings; ecog performance status:31780} Ambulatory status: {TNHAmbulation:25868}  CAREY 1 AND 3 YEAR INDEX Female (2pts) 75-79 or 80-84 (2pts) >84 (3pts) Dependence in toileting (1pt) Partial or full dependence in dressing (1pt) History of malignant neoplasm (2pts) CHF (3pts) COPD (1pts) CKD (3pts)  0-3 pts 6% 1 year mortality ; 21% 3 year mortality 4-5 pts 12% 1 year mortality ; 36% 3 year mortality >5 pts 21% 1 year mortality; 54% 3 year mortality   Past Medical History:  Diagnosis Date   Cancer ([place])    Breast Cancer   Pancreatic tumor     Past Surgical History:  Procedure Laterality Date   APPENDECTOMY     SPLENECTOMY, TOTAL      Family History  Problem Relation Age of Onset   Cancer - Ovarian Mother    Arthritis Father    Alzheimer's disease Sister    Arthritis Sister     Social History   Socioeconomic History   Marital status: Widowed    Spouse name: Not on file   Number of children: 3   Years of education: Not on file   Highest education level: Master's degree (e.g., MA, MS, MEng, MEd, MSW, MBA)  Occupational History   Not on file  Tobacco Use   Smoking status: Former    Types: Cigarettes   Smokeless tobacco: Never  Substance and Sexual Activity   Alcohol use: Yes    Comment: rare   Drug use: Never   Sexual activity: Not on file  Other Topics Concern   Not on file  Social History Narrative   Right handed    Caffeine  cup per day  Lives at home alone    Social Determinants of Health   Financial Resource Strain: Low Risk  (01/12/2022)   Overall Financial Resource Strain (CARDIA)    Difficulty of Paying Living Expenses: Not hard at all  Food Insecurity: No Food Insecurity (01/12/2022)   Hunger Vital Sign    Worried About Running  Out of Food in the Last Year: Never true    Ran Out of Food in the Last Year: Never true  Transportation Needs: No Transportation Needs (01/12/2022)   [place] - Hydrologist (Medical): No    Lack of Transportation (Non-Medical): No  Physical Activity: Sufficiently Active (01/12/2022)   Exercise Vital Sign    Days of Exercise per Week: 5 days    Minutes of Exercise per Session: 30 min  Stress: No Stress Concern Present (01/12/2022)   [place]    Feeling of Stress : Not at all  Social Connections: [place] (01/12/2022)   Social Connection and Isolation Panel [NHANES]    Frequency of Communication with Friends and Family: More than three times a week    Frequency of Social Gatherings with Friends and Family: More than three times a week    Attends Religious Services: More than 4 times per year    Active Member of [employer] or Organizations: Yes    Attends Music therapist: More than 4 times per year    Marital Status: Married  Human resources officer Violence: Not At Risk (01/12/2022)   Humiliation, Afraid, Rape, and Kick questionnaire    Fear of Current or Ex-Partner: No    Emotionally Abused: No    Physically Abused: No    Sexually Abused: No    Allergies  Allergen Reactions   Erythromycin Swelling    Current Outpatient Medications  Medication Sig Dispense Refill   alendronate (FOSAMAX) 70 MG tablet Take 1 tablet (70 mg total) by mouth once a week. 90 tablet 3   ASPIRIN 81 PO Take by mouth.     atorvastatin (LIPITOR) 20 MG tablet Take 1 tablet (20 mg total) by mouth daily. (Patient taking differently: Take 20 mg by mouth daily. 1/2 tablet every other day) 90 tablet 3   citalopram (CELEXA) 10 MG tablet TAKE ONE TABLET BY MOUTH DAILY 90 tablet 2   hydrOXYzine (ATARAX) 25 MG tablet Take 1 tablet (25 mg total) by mouth at bedtime as needed. 30 tablet 1   No current  facility-administered medications for this visit.    PHYSICAL EXAM There were no vitals filed for this visit.  Constitutional: *** appearing. *** distress. Appears *** nourished.  Neurologic: CN ***. *** focal findings. *** sensory loss. Psychiatric: *** Mood and affect symmetric and appropriate. Eyes: *** No icterus. No conjunctival pallor. Ears, nose, throat: *** mucous membranes moist. Midline trachea.  Cardiac: *** rate and rhythm.  Respiratory: *** unlabored. Abdominal: *** soft, non-tender, non-distended.  Peripheral vascular: *** Extremity: *** edema. *** cyanosis. *** pallor.  Skin: *** gangrene. *** ulceration.  Lymphatic: *** Stemmer's sign. *** palpable lymphadenopathy.    PERTINENT LABORATORY AND RADIOLOGIC DATA  Most recent CBC    Latest Ref Rng & Units 06/08/2021    3:44 PM 06/08/2021    2:35 PM  CBC  WBC 4.0 - 10.5 K/uL  8.2   Hemoglobin 12.0 - 15.0 g/dL 12.6  13.2   Hematocrit 36.0 - 46.0 % 37.0  38.8   Platelets 150 - 400 K/uL  300  Most recent CMP    Latest Ref Rng & Units 06/08/2021    3:44 PM 06/08/2021    2:35 PM  CMP  Glucose 70 - 99 mg/dL 116  151   BUN 8 - 23 mg/dL 23  20   Creatinine 0.44 - 1.00 mg/dL 1.10  1.14   Sodium 135 - 145 mmol/L 140  139   Potassium 3.5 - 5.1 mmol/L 3.8  3.9   Chloride 98 - 111 mmol/L 108  108   CO2 22 - 32 mmol/L  23   Calcium 8.9 - 10.3 mg/dL  9.1   Total Protein 6.5 - 8.1 g/dL  6.4   Total Bilirubin 0.3 - 1.2 mg/dL  0.3   Alkaline Phos 38 - 126 U/L  64   AST 15 - 41 U/L  26   ALT 0 - 44 U/L  21     Renal function CrCl cannot be calculated (Patient's most recent lab result is older than the maximum 21 days allowed.).  No results found for: "HGBA1C"  No results found for: "[place]", "LDLC", "HIRISKLDL", "POCLDL", "LDLDIRECT", "REALLDLC", "TOTLDLC"   Vascular Imaging: ***  Nancy Duncan N. Stanford Breed, MD [facility] Phone Number: [phone] 08/06/2022 12:21  PM   Total time spent on preparing this encounter including chart review, data review, collecting history, examining the patient, coordinating care for this {tnhtimebilling:26202}  Portions of this report may have been transcribed using voice recognition software.  Every effort has been made to ensure accuracy; however, inadvertent computerized transcription errors may still be present.

## 2022-08-07 ENCOUNTER — Ambulatory Visit: Payer: Medicare Other | Admitting: Vascular Surgery

## 2022-08-07 ENCOUNTER — Encounter: Payer: Self-pay | Admitting: Vascular Surgery

## 2022-08-07 VITALS — BP 152/92 | HR 97 | Temp 98.0°F | Resp 20 | Ht 65.0 in | Wt 182.0 lb

## 2022-08-07 DIAGNOSIS — M79672 Pain in left foot: Secondary | ICD-10-CM

## 2022-08-07 DIAGNOSIS — M79671 Pain in right foot: Secondary | ICD-10-CM

## 2022-08-14 ENCOUNTER — Ambulatory Visit (INDEPENDENT_AMBULATORY_CARE_PROVIDER_SITE_OTHER): Payer: Medicare Other | Admitting: Emergency Medicine

## 2022-08-14 ENCOUNTER — Encounter: Payer: Self-pay | Admitting: Emergency Medicine

## 2022-08-14 VITALS — BP 138/88 | HR 78 | Temp 98.8°F | Ht 65.0 in | Wt 183.1 lb

## 2022-08-14 DIAGNOSIS — R42 Dizziness and giddiness: Secondary | ICD-10-CM | POA: Insufficient documentation

## 2022-08-14 MED ORDER — MECLIZINE HCL 12.5 MG PO TABS: 12.5000 mg | ORAL_TABLET | Freq: Three times a day (TID) | ORAL | 1 refills | Status: DC | PRN

## 2022-08-14 NOTE — Patient Instructions (Signed)
Vertigo Vertigo is the feeling that you or the things around you are moving when they are not. This feeling can come and go at any time. Vertigo often goes away on its own. This condition can be dangerous if it happens when you are doing activities like driving or working with machines. Your doctor will do tests to find the cause of your vertigo. These tests will also help your doctor decide on the best treatment for you. Follow these instructions at home: Eating and drinking     Drink enough fluid to keep your pee (urine) pale yellow. Do not drink alcohol. Activity Return to your normal activities when your doctor says that it is safe. In the morning, first sit up on the side of the bed. When you feel okay, stand slowly while you hold onto something until you know that your balance is fine. Move slowly. Avoid sudden body or head movements or certain positions, as told by your doctor. Use a cane if you have trouble standing or walking. Sit down right away if you feel dizzy. Avoid doing any tasks or activities that can cause danger to you or others if you get dizzy. Avoid bending down if you feel dizzy. Place items in your home so that they are easy for you to reach without bending or leaning over. Do not drive or use machinery if you feel dizzy. General instructions Take over-the-counter and prescription medicines only as told by your doctor. Keep all follow-up visits. Contact a doctor if: Your medicine does not help your vertigo. Your problems get worse or you have new symptoms. You have a fever. You feel like you may vomit (nauseous), or this feeling gets worse. You start to vomit. Your family or friends see changes in how you act. You lose feeling (have numbness) in part of your body. You feel prickling and tingling in a part of your body. Get help right away if: You are always dizzy. You faint. You get very bad headaches. You get a stiff neck. Bright light starts to bother  you. You have trouble moving or talking. You feel weak in your hands, arms, or legs. You have changes in your hearing or in how you see (vision). These symptoms may be an emergency. Get help right away. Call your local emergency services (911 in the [place] Do not wait to see if the symptoms will go away. Do not drive yourself to the hospital. Summary Vertigo is the feeling that you or the things around you are moving when they are not. Your doctor will do tests to find the cause of your vertigo. You may be told to avoid some tasks, positions, or movements. Contact a doctor if your medicine is not helping, or if you have a fever, new symptoms, or a change in how you act. Get help right away if you get very bad headaches, or if you have changes in how you speak, hear, or see. This information is not intended to replace advice given to you by your health care provider. Make sure you discuss any questions you have with your health care provider. Document Revised: 06/22/2020 Document Reviewed: 06/22/2020 Elsevier Patient Education  2023 Elsevier Inc.  

## 2022-08-14 NOTE — Progress Notes (Signed)
Nancy Duncan 82 y.o.   Chief Complaint  Patient presents with   Acute Visit    F/u referral to neurologist. Patient is having issues with her left ear, vertigo    HISTORY OF PRESENT ILLNESS: This is a 82 y.o. female complaining of intermittent vertigo problems for the past couple weeks.  No syncope or associated symptoms. Has history of chronic vertigo Was recently seen at urgent care center and prescribed meclizine on 08/04/2022. Requesting follow-up with neurologist Dr. Felecia Shelling. No other complaints or medical concerns today.  HPI   Prior to Admission medications   Medication Sig Start Date End Date Taking? Authorizing Provider  alendronate (FOSAMAX) 70 MG tablet Take 1 tablet (70 mg total) by mouth once a week. 08/14/21  Yes Horald Pollen, MD  ASPIRIN 81 PO Take by mouth.   Yes [provider]  atorvastatin (LIPITOR) 20 MG tablet Take 1 tablet (20 mg total) by mouth daily. Patient taking differently: Take 20 mg by mouth daily. 1/2 tablet every other day 08/14/21  Yes Akim Watkinson, Ines Bloomer, MD  citalopram (CELEXA) 10 MG tablet TAKE ONE TABLET BY MOUTH DAILY 06/14/22  Yes Lavonta Tillis, Ines Bloomer, MD  meclizine (ANTIVERT) 12.5 MG tablet Take 1 tablet (12.5 mg total) by mouth 3 (three) times daily as needed for dizziness. 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   History of TIA (transient ischemic attack) 08/02/2021   Pancreatic tumor 08/02/2021   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   Ventral hernia without obstruction or gangrene 08/02/2021   TIA (transient ischemic attack) 06/15/2021    Past Medical History:  Diagnosis  Date   Cancer [facility])    Breast Cancer   Pancreatic tumor     Past Surgical History:  Procedure Laterality Date   APPENDECTOMY     SPLENECTOMY, TOTAL      Social History   Socioeconomic History   Marital status: Widowed    Spouse name: Not on file   Number of children: 3   Years of education: Not on file   Highest education level: Master's degree (e.g., MA, MS, MEng, MEd, MSW, MBA)  Occupational History   Not on file  Tobacco Use   Smoking status: Former    Types: Cigarettes   Smokeless tobacco: Never  Vaping Use   Vaping Use: Never used  Substance and Sexual Activity   Alcohol use: Yes    Comment: rare   Drug use: Never   Sexual activity: Not on file  Other Topics Concern   Not on file  Social History Narrative   Right handed    Caffeine  cup per day   Lives at home alone    Social Determinants of Health   Financial Resource Strain: Low Risk  (01/12/2022)   Overall Financial Resource Strain (CARDIA)    Difficulty of Paying Living Expenses: Not hard at all  Food Insecurity: No Food Insecurity (01/12/2022)   Hunger Vital Sign    Worried About Running Out of Food in the Last Year: Never true    [place] in the Last Year: Never true  Transportation Needs: No Transportation Needs (01/12/2022)   [place] -  Hydrologist (Medical): No    Lack of Transportation (Non-Medical): No  Physical Activity: Sufficiently Active (01/12/2022)   Exercise Vital Sign    Days of Exercise per Week: 5 days    Minutes of Exercise per Session: 30 min  Stress: No Stress Concern Present (01/12/2022)   [place]    Feeling of Stress : Not at all  Social Connections: [place] (01/12/2022)   Social Connection and Isolation Panel [NHANES]    Frequency of Communication with Friends and Family: More than three times a week    Frequency of Social Gatherings with Friends and Family: More than  three times a week    Attends Religious Services: More than 4 times per year    Active Member of [employer] or Organizations: Yes    Attends Music therapist: More than 4 times per year    Marital Status: Married  Human resources officer Violence: Not At Risk (01/12/2022)   Humiliation, Afraid, Rape, and Kick questionnaire    Fear of Current or Ex-Partner: No    Emotionally Abused: No    Physically Abused: No    Sexually Abused: No    Family History  Problem Relation Age of Onset   Cancer - Ovarian Mother    Arthritis Father    Alzheimer's disease Sister    Arthritis Sister      Review of Systems  Constitutional: Negative.  Negative for chills and fever.  HENT: Negative.  Negative for congestion and sore throat.   Respiratory: Negative.  Negative for cough and shortness of breath.   Cardiovascular: Negative.  Negative for chest pain and palpitations.  Gastrointestinal:  Negative for abdominal pain, diarrhea, nausea and vomiting.  Genitourinary: Negative.  Negative for dysuria and hematuria.  Skin: Negative.  Negative for rash.  Neurological:  Positive for dizziness. Negative for sensory change, speech change, focal weakness and headaches.  All other systems reviewed and are negative.  Today's Vitals   08/14/22 1351  BP: 138/88  Pulse: 78  Temp: 98.8 F (37.1 C)  TempSrc: Oral  SpO2: 93%  Weight: 183 lb 2 oz (83.1 kg)  Height: '5\' 5"'$  (1.651 m)   Body mass index is 30.47 kg/m.   Physical Exam Vitals reviewed.  Constitutional:      Appearance: Normal appearance.  HENT:     Head: Normocephalic.     Right Ear: There is impacted cerumen.     Left Ear: Tympanic membrane, ear canal and external ear normal.     Mouth/Throat:     Mouth: Mucous membranes are moist.     Pharynx: Oropharynx is clear.  Eyes:     Extraocular Movements: Extraocular movements intact.     Conjunctiva/sclera: Conjunctivae normal.     Pupils: Pupils are equal, round, and reactive to light.   Neck:     Vascular: No carotid bruit.  Cardiovascular:     Rate and Rhythm: Normal rate and regular rhythm.     Pulses: Normal pulses.     Heart sounds: Normal heart sounds.  Pulmonary:     Effort: Pulmonary effort is normal.     Breath sounds: Normal breath sounds.  Abdominal:     Palpations: Abdomen is soft.     Tenderness: There is no abdominal tenderness.  Musculoskeletal:     Cervical back: No tenderness.  Lymphadenopathy:     Cervical: No cervical adenopathy.  Skin:    General: Skin is  warm and dry.  Neurological:     General: No focal deficit present.     Mental Status: She is alert and oriented to person, place, and time.     Cranial Nerves: No cranial nerve deficit.     Sensory: No sensory deficit.     Motor: No weakness.     Coordination: Coordination normal.     Gait: Gait normal.  Psychiatric:        Mood and Affect: Mood normal.        Behavior: Behavior normal.      ASSESSMENT & PLAN: A total of 47 minutes was spent with the patient and counseling/coordination of care regarding preparing for this visit, review of most recent office visit notes, review of chronic medical conditions and their management, review of all medications, review of most recent blood work results, differential diagnosis of vertigo and management using meclizine as needed, ED precautions, need for neurology evaluation, prognosis, documentation and need for follow-up.  Problem List Items Addressed This Visit       Other   Vertigo - Primary    Clinically stable.  No red flag signs or symptoms. No new medications.  Stable vital signs.  Normal neurological examination. No signs of stroke on physical examination. Has history of vertigo in the past.  Feels the same.  No new symptomatology.  Meclizine helps.  Recommend to continue meclizine every 6-8 hours as needed. Recent upper respiratory viral illness.  Labyrinthitis a possible sequela.  Slowly getting better. Recommend neurology  follow-up.  Referral placed today with neurologist requested. ED precautions given. Advised to contact the office if no better or worse during the next several days.      Relevant Medications   meclizine (ANTIVERT) 12.5 MG tablet   Other Relevant Orders   Ambulatory referral to Neurology   Patient Instructions  Vertigo Vertigo is the feeling that you or the things around you are moving when they are not. This feeling can come and go at any time. Vertigo often goes away on its own. This condition can be dangerous if it happens when you are doing activities like driving or working with machines. Your doctor will do tests to find the cause of your vertigo. These tests will also help your doctor decide on the best treatment for you. Follow these instructions at home: Eating and drinking     Drink enough fluid to keep your pee (urine) pale yellow. Do not drink alcohol. Activity Return to your normal activities when your doctor says that it is safe. In the morning, first sit up on the side of the bed. When you feel okay, stand slowly while you hold onto something until you know that your balance is fine. Move slowly. Avoid sudden body or head movements or certain positions, as told by your doctor. Use a cane if you have trouble standing or walking. Sit down right away if you feel dizzy. Avoid doing any tasks or activities that can cause danger to you or others if you get dizzy. Avoid bending down if you feel dizzy. Place items in your home so that they are easy for you to reach without bending or leaning over. Do not drive or use machinery if you feel dizzy. General instructions Take over-the-counter and prescription medicines only as told by your doctor. Keep all follow-up visits. Contact a doctor if: Your medicine does not help your vertigo. Your problems get worse or you have new symptoms. You have a fever. You feel like  you may vomit (nauseous), or this feeling gets worse. You  start to vomit. Your family or friends see changes in how you act. You lose feeling (have numbness) in part of your body. You feel prickling and tingling in a part of your body. Get help right away if: You are always dizzy. You faint. You get very bad headaches. You get a stiff neck. Bright light starts to bother you. You have trouble moving or talking. You feel weak in your hands, arms, or legs. You have changes in your hearing or in how you see (vision). These symptoms may be an emergency. Get help right away. Call your local emergency services (911 in the [place] Do not wait to see if the symptoms will go away. Do not drive yourself to the hospital. Summary Vertigo is the feeling that you or the things around you are moving when they are not. Your doctor will do tests to find the cause of your vertigo. You may be told to avoid some tasks, positions, or movements. Contact a doctor if your medicine is not helping, or if you have a fever, new symptoms, or a change in how you act. Get help right away if you get very bad headaches, or if you have changes in how you speak, hear, or see. This information is not intended to replace advice given to you by your health care provider. Make sure you discuss any questions you have with your health care provider. Document Revised: 06/22/2020 Document Reviewed: 06/22/2020 Elsevier Patient Education  2023 [place], MD [facility]

## 2022-08-14 NOTE — Assessment & Plan Note (Signed)
Clinically stable.  No red flag signs or symptoms. No new medications.  Stable vital signs.  Normal neurological examination. No signs of stroke on physical examination. Has history of vertigo in the past.  Feels the same.  No new symptomatology.  Meclizine helps.  Recommend to continue meclizine every 6-8 hours as needed. Recent upper respiratory viral illness.  Labyrinthitis a possible sequela.  Slowly getting better. Recommend neurology follow-up.  Referral placed today with neurologist requested. ED precautions given. Advised to contact the office if no better or worse during the next several days.

## 2022-08-23 ENCOUNTER — Ambulatory Visit: Payer: Medicare Other | Admitting: Neurology

## 2022-08-23 ENCOUNTER — Encounter: Payer: Self-pay | Admitting: Neurology

## 2022-08-23 ENCOUNTER — Telehealth: Payer: Self-pay | Admitting: Neurology

## 2022-08-23 VITALS — BP 164/91 | HR 85 | Ht 65.0 in | Wt 186.0 lb

## 2022-08-23 DIAGNOSIS — G629 Polyneuropathy, unspecified: Secondary | ICD-10-CM | POA: Diagnosis not present

## 2022-08-23 DIAGNOSIS — D490 Neoplasm of unspecified behavior of digestive system: Secondary | ICD-10-CM

## 2022-08-23 DIAGNOSIS — R208 Other disturbances of skin sensation: Secondary | ICD-10-CM

## 2022-08-23 DIAGNOSIS — G5632 Lesion of radial nerve, left upper limb: Secondary | ICD-10-CM

## 2022-08-23 DIAGNOSIS — R42 Dizziness and giddiness: Secondary | ICD-10-CM | POA: Diagnosis not present

## 2022-08-23 DIAGNOSIS — H6121 Impacted cerumen, right ear: Secondary | ICD-10-CM

## 2022-08-23 DIAGNOSIS — D519 Vitamin B12 deficiency anemia, unspecified: Secondary | ICD-10-CM | POA: Diagnosis not present

## 2022-08-23 DIAGNOSIS — R799 Abnormal finding of blood chemistry, unspecified: Secondary | ICD-10-CM | POA: Diagnosis not present

## 2022-08-23 NOTE — Telephone Encounter (Signed)
Referral for ENT fax to [facility]. Phone: [phone], Fax: [phone]. 

## 2022-08-23 NOTE — Progress Notes (Signed)
[facility]  PATIENT: Nancy Duncan DOB: 02-12-1941  REFERRING DOCTOR OR PCP: Octaviano Glow MD  SOURCE: Patient, notes from primary care  _________________________________   HISTORICAL  CHIEF COMPLAINT:  Chief Complaint  Patient presents with   Room 11    Pt is here Alone. Pt states that she thinks she has an impacted wax in her ears. Pt states that her Vertigo is happening more frequently. Pt states no burning in her feet. Pt states that she has tightness in her feel.     HISTORY OF PRESENT ILLNESS:  Kelee Gerald Stabs) Hoeg is a 82 y.o. woman woth vertigo and a burning sensation in her feet.     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  Currently, she reports vertigo.  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   She tried ear wax drops but they had not helped.   Vertigo is nearly constant woth fluctuations.  It is rotational.  She does not veer when she walks.   Hearing is fine.    Right ear feels stuffed up.   She had impacted wx in past with similar feeling.     She has had a tight sensational in legs and numbness in feet.   She has had a vascular evaluation which was fine.     She has a dry mouth but not dry eyes.    She has no rashesSymptoms started many years ago and she has taken vitamins.   Symptoms worsened more recently   She denies foot weakness  The arm weakness from the PION has improved to baseline.     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  She had amaurosis fugax on the right leading to additional tests.     Lipitor was prescribed but she stopped and we discussed her getting back on.     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    She denies neck pain.  No urinary urgency or changes.    She denies numbness.    No neck, arm or axillary.      I personally reviewed the MRI of the brain from 06/08/2021.  It shows  minimal age appropriate generalized cortical atrophy and mild chronic microvascular ischemic changes in the pons and hemispheres.  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  REVIEW OF SYSTEMS: Constitutional: No fevers, chills, sweats, or change in appetite Eyes: No visual changes, double vision, eye pain Ear, nose and throat: No hearing loss, ear pain, nasal congestion, sore throat Cardiovascular: No chest pain, palpitations Respiratory:  No shortness of breath at rest or with exertion.   No wheezes GastrointestinaI: No nausea, vomiting, diarrhea, abdominal pain, fecal incontinence Genitourinary:  No dysuria, urinary retention or frequency.  No nocturia. Musculoskeletal:  No neck pain, back pain Integumentary: No rash, pruritus, skin lesions Neurological: as above Psychiatric: No depression at this time.  No anxiety Endocrine: No palpitations, diaphoresis, change in appetite, change in weigh or increased thirst Hematologic/Lymphatic:  No anemia, purpura, petechiae. Allergic/Immunologic: No itchy/runny eyes, nasal congestion, recent allergic reactions, rashes  ALLERGIES: Allergies  Allergen Reactions   Erythromycin Swelling    HOME MEDICATIONS:  Current Outpatient Medications:    alendronate (FOSAMAX) 70 MG tablet, Take 1 tablet (70 mg total) by mouth once a week., Disp: 90 tablet, Rfl: 3   ASPIRIN 81 PO,  Take by mouth., Disp: , Rfl:    atorvastatin (LIPITOR) 20 MG tablet, Take 1 tablet (20 mg total) by mouth daily. (Patient taking differently: Take 20 mg by mouth daily. 1/2 tablet every other day), Disp: 90 tablet, Rfl: 3   citalopram (CELEXA) 10 MG tablet, TAKE ONE TABLET BY MOUTH DAILY, Disp: 90 tablet, Rfl: 2   meclizine (ANTIVERT) 12.5 MG tablet, Take 1 tablet (12.5 mg total) by mouth 3 (three) times daily as needed for dizziness., Disp: 30 tablet, Rfl: 1  PAST MEDICAL HISTORY: Past Medical History:  Diagnosis Date   Cancer ([place])    Breast Cancer   Pancreatic tumor      PAST SURGICAL HISTORY: Past Surgical History:  Procedure Laterality Date   APPENDECTOMY     SPLENECTOMY, TOTAL      FAMILY HISTORY: Family History  Problem Relation Age of Onset   Cancer - Ovarian Mother    Arthritis Father    Alzheimer's disease Sister    Arthritis Sister     SOCIAL HISTORY:  Social History   Socioeconomic History   Marital status: Widowed    Spouse name: Not on file   Number of children: 3   Years of education: Not on file   Highest education level: Master's degree (e.g., MA, MS, MEng, MEd, MSW, MBA)  Occupational History   Not on file  Tobacco Use   Smoking status: Former    Types: Cigarettes   Smokeless tobacco: Never  Vaping Use   Vaping Use: Never used  Substance and Sexual Activity   Alcohol use: Yes    Comment: rare   Drug use: Never   Sexual activity: Not on file  Other Topics Concern   Not on file  Social History Narrative   Right handed    Caffeine  cup per day   Lives at home alone    Social Determinants of Health   Financial Resource Strain: Low Risk  (01/12/2022)   Overall Financial Resource Strain (CARDIA)    Difficulty of Paying Living Expenses: Not hard at all  Food Insecurity: No Food Insecurity (01/12/2022)   Hunger Vital Sign    Worried About Running Out of Food in the Last Year: Never true    [place] in the Last Year: Never true  Transportation Needs: No Transportation Needs (01/12/2022)   PRAPARE - Hydrologist (Medical): No    Lack of Transportation (Non-Medical): No  Physical Activity: Sufficiently Active (01/12/2022)   Exercise Vital Sign    Days of Exercise per Week: 5 days    Minutes of Exercise per Session: 30 min  Stress: No Stress Concern Present (01/12/2022)   [place]    Feeling of Stress : Not at all  Social Connections: [place] (01/12/2022)   Social Connection and Isolation Panel [NHANES]     Frequency of Communication with Friends and Family: More than three times a week    Frequency of Social Gatherings with Friends and Family: More than three times a week    Attends Religious Services: More than 4 times per year    Active Member of [employer] or Organizations: Yes    Attends Archivist Meetings: More than 4 times per year    Marital Status: Married  Human resources officer Violence: Not At Risk (01/12/2022)   Humiliation, Afraid, Rape, and Kick questionnaire    Fear of Current or Ex-Partner: No    Emotionally  Abused: No    Physically Abused: No    Sexually Abused: No     PHYSICAL EXAM  Vitals:   08/23/22 0852  BP: (!) 164/91  Pulse: 85  Weight: 186 lb (84.4 kg)  Height: '5\' 5"'$  (1.651 m)    Body mass index is 30.95 kg/m.   General: The patient is well-developed and well-nourished and in no acute distress  HEENT:  Head is [place]/AT.  Sclera are anicteric.   There was increase cerumen on the right and inflammation of the ear canal.  I was unable to visualize the right tympanic membrane.  The ear canal and tympanic membrane were normal on the left  Skin: Extremities are without rash or  edema.  Musculoskeletal:  Back is nontender  Neurologic Exam  Mental status: The patient is alert and oriented x 3 at the time of the examination. The patient has apparent normal recent and remote memory, with an apparently normal attention span and concentration ability.   Speech is normal.  Cranial nerves: Extraocular movements are full.  Facial strength and sensation was normal.  Hearing was slightly asymmetric.  Motor:  Muscle bulk is normal.   Tone is normal. Strength is  5 / 5 in the right hand/arm and both legs.  Strength was 5/5 in all proximal muscles of the left arm and in the finger flexors, wrist flexors and pronators.  Strength was 4+/5 in the wrist extensors 4/5 in finger extensors and 4/5 in ulnar and median innervated hand muscles  Sensory: Sensory testing is intact to  pinprick, soft touch and vibration sensation in the arms.  In the legs, vibration sensation was normal at the knees, 80% at the ankles and 20% at the toes.  Pinprick sensation was normal in the feet and legs.  Coordination: Cerebellar testing reveals good finger-nose-finger and heel-to-shin bilaterally.  Gait and station: Station is normal.   Gait is normal. Tandem gait is mildly wide but probably normal for age. Romberg is negative.   Reflexes: Deep tendon reflexes are symmetric and normal in the arms and absent in the legs.   Plantar responses are flexor.    DIAGNOSTIC DATA (LABS, IMAGING, TESTING) - I reviewed patient records, labs, notes, testing and imaging myself where available.  Lab Results  Component Value Date   WBC 8.2 06/08/2021   HGB 12.6 06/08/2021   HCT 37.0 06/08/2021   MCV 95.6 06/08/2021   PLT 300 06/08/2021      Component Value Date/Time   NA 140 06/08/2021 1544   K 3.8 06/08/2021 1544   CL 108 06/08/2021 1544   CO2 23 06/08/2021 1435   GLUCOSE 116 (H) 06/08/2021 1544   BUN 23 06/08/2021 1544   CREATININE 1.10 (H) 06/08/2021 1544   CALCIUM 9.1 06/08/2021 1435   PROT 6.4 (L) 06/08/2021 1435   ALBUMIN 3.6 06/08/2021 1435   AST 26 06/08/2021 1435   ALT 21 06/08/2021 1435   ALKPHOS 64 06/08/2021 1435   BILITOT 0.3 06/08/2021 1435   GFRNONAA 48 (L) 06/08/2021 1435        ASSESSMENT AND PLAN  Impacted cerumen of right ear - Plan: Ambulatory referral to ENT  Polyneuropathy - Plan: Rheumatoid factor, Vitamin B12, Multiple Myeloma Panel (SPEP&IFE w/QIG), [place], serum, Hemoglobin A1c, Anti-Hu, Anti-Ri, Anti-Yo IFA  Pancreatic tumor - Plan: Hemoglobin A1c, Anti-Hu, Anti-Ri, Anti-Yo IFA  Posterior interosseous mononeuropathy, left  Dysesthesia - Plan: Rheumatoid factor, Vitamin B12, Multiple Myeloma Panel (SPEP&IFE w/QIG), [place], serum, Hemoglobin A1c, Anti-Hu, Anti-Ri, Anti-Yo IFA  Vertigo - Plan: Ambulatory referral to ENT  Labs for neuropathy If  neuropathic symptoms worsen, will check NCV/EMG Refer to ENT for impacted cerumen/vertigo.  Hopefully, the vertigo will improve when the earwax is removed and the inflammation resolves.  However, if vertigo worsens, will check MRi. Rtc prn  Zell Hylton A. Felecia Shelling, MD, [facility] 2/50/5397, 6:73 AM Certified in Neurology, Clinical Neurophysiology, Sleep Medicine and Neuroimaging  [facility] [address], [place],  [address] [phone]

## 2022-08-28 LAB — COPPER, SERUM: Copper: 146 ug/dL (ref 80–158)

## 2022-08-28 LAB — MULTIPLE MYELOMA PANEL, SERUM
Albumin SerPl Elph-Mcnc: 3.9 g/dL (ref 2.9–4.4)
Albumin/Glob SerPl: 1.4 (ref 0.7–1.7)
Alpha 1: 0.3 g/dL (ref 0.0–0.4)
Alpha2 Glob SerPl Elph-Mcnc: 0.7 g/dL (ref 0.4–1.0)
B-Globulin SerPl Elph-Mcnc: 1 g/dL (ref 0.7–1.3)
Gamma Glob SerPl Elph-Mcnc: 1 g/dL (ref 0.4–1.8)
Globulin, Total: 3 g/dL (ref 2.2–3.9)
IgA/Immunoglobulin A, Serum: 94 mg/dL (ref 64–422)
IgG (Immunoglobin G), Serum: 904 mg/dL (ref 586–1602)
IgM (Immunoglobulin M), Srm: 200 mg/dL (ref 26–217)
Total Protein: 6.9 g/dL (ref 6.0–8.5)

## 2022-08-28 LAB — HEMOGLOBIN A1C
Est. average glucose Bld gHb Est-mCnc: 117 mg/dL
Hgb A1c MFr Bld: 5.7 % — ABNORMAL HIGH (ref 4.8–5.6)

## 2022-08-28 LAB — ANTI-HU, ANTI-RI, ANTI-YO IFA
Anti-Hu Ab: NEGATIVE
Anti-Ri Ab: NEGATIVE
Anti-Yo Ab: NEGATIVE

## 2022-08-28 LAB — VITAMIN B12: Vitamin B-12: 944 pg/mL (ref 232–1245)

## 2022-08-28 LAB — RHEUMATOID FACTOR: Rheumatoid fact SerPl-aCnc: 10.4 IU/mL (ref <14.0)

## 2022-09-02 ENCOUNTER — Encounter: Payer: Self-pay | Admitting: Neurology

## 2022-09-03 ENCOUNTER — Telehealth: Payer: Self-pay

## 2022-09-03 NOTE — Telephone Encounter (Signed)
Please look at Message from today 09/03/2022

## 2022-09-14 ENCOUNTER — Other Ambulatory Visit: Payer: Self-pay | Admitting: Emergency Medicine

## 2022-09-19 ENCOUNTER — Other Ambulatory Visit: Payer: Self-pay | Admitting: Emergency Medicine

## 2022-09-19 DIAGNOSIS — Z1231 Encounter for screening mammogram for malignant neoplasm of breast: Secondary | ICD-10-CM

## 2022-09-25 DIAGNOSIS — K769 Liver disease, unspecified: Secondary | ICD-10-CM | POA: Diagnosis not present

## 2022-09-25 DIAGNOSIS — C7A8 Other malignant neuroendocrine tumors: Secondary | ICD-10-CM | POA: Diagnosis not present

## 2022-09-25 DIAGNOSIS — K869 Disease of pancreas, unspecified: Secondary | ICD-10-CM | POA: Diagnosis not present

## 2022-09-25 DIAGNOSIS — Z90410 Acquired total absence of pancreas: Secondary | ICD-10-CM | POA: Diagnosis not present

## 2022-09-25 DIAGNOSIS — K432 Incisional hernia without obstruction or gangrene: Secondary | ICD-10-CM | POA: Diagnosis not present

## 2022-09-25 DIAGNOSIS — Z9081 Acquired absence of spleen: Secondary | ICD-10-CM | POA: Diagnosis not present

## 2022-09-25 DIAGNOSIS — K439 Ventral hernia without obstruction or gangrene: Secondary | ICD-10-CM | POA: Diagnosis not present

## 2022-09-25 DIAGNOSIS — Z87891 Personal history of nicotine dependence: Secondary | ICD-10-CM | POA: Diagnosis not present

## 2022-10-01 ENCOUNTER — Institutional Professional Consult (permissible substitution): Payer: Medicare Other | Admitting: Neurology

## 2022-10-08 ENCOUNTER — Encounter: Payer: Self-pay | Admitting: Emergency Medicine

## 2022-10-08 NOTE — Telephone Encounter (Signed)
Prevnar 20 is good for 1 shot

## 2022-10-17 DIAGNOSIS — Z87898 Personal history of other specified conditions: Secondary | ICD-10-CM | POA: Diagnosis not present

## 2022-10-17 DIAGNOSIS — H6121 Impacted cerumen, right ear: Secondary | ICD-10-CM | POA: Diagnosis not present

## 2022-11-06 DIAGNOSIS — H353132 Nonexudative age-related macular degeneration, bilateral, intermediate dry stage: Secondary | ICD-10-CM | POA: Diagnosis not present

## 2022-11-06 DIAGNOSIS — H401132 Primary open-angle glaucoma, bilateral, moderate stage: Secondary | ICD-10-CM | POA: Diagnosis not present

## 2022-11-07 ENCOUNTER — Ambulatory Visit
Admission: RE | Admit: 2022-11-07 | Discharge: 2022-11-07 | Disposition: A | Discharge status: Home / Self Care | Payer: Medicare Other | Source: Ambulatory Visit | Attending: Emergency Medicine | Admitting: Emergency Medicine

## 2022-11-07 DIAGNOSIS — Z1231 Encounter for screening mammogram for malignant neoplasm of breast: Secondary | ICD-10-CM

## 2022-11-07 HISTORY — DX: Personal history of irradiation: Z92.3

## 2022-11-07 HISTORY — DX: Malignant neoplasm of unspecified site of unspecified female breast: C50.919

## 2022-11-09 DIAGNOSIS — L814 Other melanin hyperpigmentation: Secondary | ICD-10-CM | POA: Diagnosis not present

## 2022-11-09 DIAGNOSIS — C44729 Squamous cell carcinoma of skin of left lower limb, including hip: Secondary | ICD-10-CM | POA: Diagnosis not present

## 2022-11-09 DIAGNOSIS — L821 Other seborrheic keratosis: Secondary | ICD-10-CM | POA: Diagnosis not present

## 2022-11-09 DIAGNOSIS — D485 Neoplasm of uncertain behavior of skin: Secondary | ICD-10-CM | POA: Diagnosis not present

## 2022-11-09 DIAGNOSIS — D225 Melanocytic nevi of trunk: Secondary | ICD-10-CM | POA: Diagnosis not present

## 2022-11-14 ENCOUNTER — Encounter: Payer: Self-pay | Admitting: Emergency Medicine

## 2022-11-14 NOTE — Telephone Encounter (Signed)
Looks like she is intolerant to statins.  Stop Lipitor.  Thanks.

## 2022-11-21 ENCOUNTER — Encounter: Payer: Self-pay | Admitting: Emergency Medicine

## 2022-11-21 NOTE — Telephone Encounter (Signed)
No

## 2022-11-22 DIAGNOSIS — C44729 Squamous cell carcinoma of skin of left lower limb, including hip: Secondary | ICD-10-CM | POA: Diagnosis not present

## 2022-11-22 DIAGNOSIS — D485 Neoplasm of uncertain behavior of skin: Secondary | ICD-10-CM | POA: Diagnosis not present

## 2022-12-06 DIAGNOSIS — L821 Other seborrheic keratosis: Secondary | ICD-10-CM | POA: Diagnosis not present

## 2022-12-24 DIAGNOSIS — Z87891 Personal history of nicotine dependence: Secondary | ICD-10-CM | POA: Diagnosis not present

## 2022-12-24 DIAGNOSIS — C7A8 Other malignant neuroendocrine tumors: Secondary | ICD-10-CM | POA: Diagnosis not present

## 2022-12-24 DIAGNOSIS — K439 Ventral hernia without obstruction or gangrene: Secondary | ICD-10-CM | POA: Diagnosis not present

## 2022-12-27 DIAGNOSIS — C44729 Squamous cell carcinoma of skin of left lower limb, including hip: Secondary | ICD-10-CM | POA: Diagnosis not present

## 2023-01-05 ENCOUNTER — Other Ambulatory Visit: Payer: Self-pay | Admitting: Emergency Medicine

## 2023-01-08 DIAGNOSIS — H401132 Primary open-angle glaucoma, bilateral, moderate stage: Secondary | ICD-10-CM | POA: Diagnosis not present

## 2023-01-08 DIAGNOSIS — H353132 Nonexudative age-related macular degeneration, bilateral, intermediate dry stage: Secondary | ICD-10-CM | POA: Diagnosis not present

## 2023-01-10 ENCOUNTER — Ambulatory Visit (INDEPENDENT_AMBULATORY_CARE_PROVIDER_SITE_OTHER): Payer: Medicare Other

## 2023-01-10 VITALS — Ht 65.0 in | Wt 186.0 lb

## 2023-01-10 DIAGNOSIS — Z0000 Encounter for general adult medical examination without abnormal findings: Secondary | ICD-10-CM | POA: Diagnosis not present

## 2023-01-10 DIAGNOSIS — Z13820 Encounter for screening for osteoporosis: Secondary | ICD-10-CM | POA: Diagnosis not present

## 2023-01-10 NOTE — Progress Notes (Signed)
Subjective:   Nancy Duncan is a 82 y.o. female who presents for Medicare Annual (Subsequent) preventive examination.  Review of Systems    Virtual Visit via Telephone Note  I connected with  Delice Bison on 01/10/23 at  9:30 AM EDT by telephone and verified that I am speaking with the correct person using two identifiers.  Location: Patient: Home Provider: Office Persons participating in the virtual visit: patient/Nurse Health Advisor   I discussed the limitations, risks, security and privacy concerns of performing an evaluation and management service by telephone and the availability of in person appointments. The patient expressed understanding and agreed to proceed.  Interactive audio and video telecommunications were attempted between this nurse and patient, however failed, due to patient having technical difficulties OR patient did not have access to video capability.  We continued and completed visit with audio only.  Some vital signs may be absent or patient reported.   Tillie Rung, LPN  Cardiac Risk Factors include: advanced age (>41men, >70 women)     Objective:    Today's Vitals   01/10/23 1401  Weight: 186 lb (84.4 kg)  Height: 5\' 5"  (1.651 m)   Body mass index is 30.95 kg/m.     01/10/2023    2:12 PM 01/12/2022    1:06 PM  Advanced Directives  Does Patient Have a Medical Advance Directive? Yes Yes  Type of Estate agent of [place];Living will Living will;Healthcare Power of Attorney  Does patient want to make changes to medical advance directive?  No - Patient declined  Copy of Healthcare Power of Attorney in Chart? No - copy requested No - copy requested    Current Medications (verified) Outpatient Encounter Medications as of 01/10/2023  Medication Sig   alendronate (FOSAMAX) 70 MG tablet TAKE ONE TABLET BY MOUTH ONCE A WEEK   ASPIRIN 81 PO Take by mouth.   citalopram (CELEXA) 10 MG tablet TAKE 1 TABLET BY MOUTH DAILY   meclizine  (ANTIVERT) 12.5 MG tablet Take 1 tablet (12.5 mg total) by mouth 3 (three) times daily as needed for dizziness.   [DISCONTINUED] atorvastatin (LIPITOR) 20 MG tablet Take 1 tablet (20 mg total) by mouth daily. (Patient taking differently: Take 20 mg by mouth daily. 1/2 tablet every other day)   No facility-administered encounter medications on file as of 01/10/2023.    Allergies (verified) Erythromycin   History: Past Medical History:  Diagnosis Date   Breast cancer (H[facility])    Cancer (H[facility])    Breast Cancer   Pancreatic tumor    Personal history of radiation therapy    Past Surgical History:  Procedure Laterality Date   APPENDECTOMY     BREAST LUMPECTOMY     SPLENECTOMY, TOTAL     Family History  Problem Relation Age of Onset   Cancer - Ovarian Mother    Arthritis Father    Alzheimer's disease Sister    Arthritis Sister    Social History   Socioeconomic History   Marital status: Widowed    Spouse name: Not on file   Number of children: 3   Years of education: Not on file   Highest education level: Master's degree (e.g., MA, MS, MEng, MEd, MSW, MBA)  Occupational History   Not on file  Tobacco Use   Smoking status: Former    Types: Cigarettes   Smokeless tobacco: Never  Vaping Use   Vaping Use: Never used  Substance and Sexual Activity   Alcohol use: Yes  Comment: rare   Drug use: Never   Sexual activity: Not on file  Other Topics Concern   Not on file  Social History Narrative   Right handed    Caffeine  cup per day   Lives at home alone    Social Determinants of Health   Financial Resource Strain: Low Risk  (01/10/2023)   Overall Financial Resource Strain (CARDIA)    Difficulty of Paying Living Expenses: Not hard at all  Food Insecurity: No Food Insecurity (01/10/2023)   Hunger Vital Sign    Worried About Running Out of Food in the Last Year: Never true    Ran Out of Food in the Last Year: Never true  Transportation Needs: No Transportation Needs  (01/10/2023)   PRAPARE - Administrator, Civil Service (Medical): No    Lack of Transportation (Non-Medical): No  Physical Activity: Insufficiently Active (01/10/2023)   Exercise Vital Sign    Days of Exercise per Week: 4 days    Minutes of Exercise per Session: 30 min  Stress: No Stress Concern Present (01/10/2023)   [employer] of Occupational Health - Occupational Stress Questionnaire    Feeling of Stress : Not at all  Social Connections: Moderately Integrated (01/10/2023)   Social Connection and Isolation Panel [NHANES]    Frequency of Communication with Friends and Family: More than three times a week    Frequency of Social Gatherings with Friends and Family: More than three times a week    Attends Religious Services: More than 4 times per year    Active Member of [employer] or Organizations: Yes    Attends Banker Meetings: More than 4 times per year    Marital Status: Widowed    Tobacco Counseling Counseling given: Not Answered   Clinical Intake:  Pre-visit preparation completed: Yes  Pain : No/denies pain     BMI - recorded: 30.95 Nutritional Status: BMI > 30  Obese Nutritional Risks: None Diabetes: No  How often do you need to have someone help you when you read instructions, pamphlets, or other written materials from your doctor or pharmacy?: 1 - Never  Diabetic?  No  Interpreter Needed?: No  Information entered by :: Theresa Mulligan LPN   Activities of Daily Living    01/10/2023    2:10 PM 01/08/2023    1:40 PM  In your present state of health, do you have any difficulty performing the following activities:  Hearing? 0 0  Vision? 0 0  Difficulty concentrating or making decisions? 0 0  Walking or climbing stairs? 0 0  Dressing or bathing? 0 0  Doing errands, shopping? 0 0  Preparing Food and eating ? N N  Using the Toilet? N N  In the past six months, have you accidently leaked urine? N N  Do you have problems with loss of bowel  control? N N  Managing your Medications? N N  Managing your Finances? N N  Housekeeping or managing your Housekeeping? N N    Patient Care Team: [name], MD as PCP - General (Internal Medicine)  Indicate any recent Medical Services you may have received from other than [facility] providers in the past year (date may be approximate).     Assessment:   This is a routine wellness examination for Verenice.  Hearing/Vision screen Hearing Screening - Comments:: ,Denies hearing difficulties   Vision Screening - Comments:: Wears rx glasses - up to date with routine eye exams with  [facility]  Dietary issues and exercise activities discussed: Current Exercise Habits: Home exercise routine, Type of exercise: walking, Time (Minutes): 30, Frequency (Times/Week): 4, Weekly Exercise (Minutes/Week): 120, Intensity: Moderate, Exercise limited by: None identified   Goals Addressed               This Visit's Progress     Stay Alive (pt-stated)         Depression Screen    01/10/2023    2:09 PM 08/14/2022    1:52 PM 07/25/2022    2:37 PM 01/15/2022    9:28 AM 01/12/2022    1:19 PM 08/02/2021    2:55 PM  PHQ 2/9 Scores  PHQ - 2 Score 0 0 0 0 0 0    Fall Risk    01/10/2023    2:11 PM 01/08/2023    1:40 PM 08/14/2022    1:52 PM 07/25/2022    2:38 PM 07/25/2022    2:36 PM  Fall Risk   Falls in the past year? 1 1 0 0 1  Number falls in past yr: 0 0 0  0  Injury with Fall? 0 0 0  1  Risk for fall due to : No Fall Risks  No Fall Risks  No Fall Risks  Follow up Falls prevention discussed  Falls evaluation completed  Falls evaluation completed    FALL RISK PREVENTION PERTAINING TO THE HOME:  Any stairs in or around the home? Yes  If so, are there any without handrails? No  Home free of loose throw rugs in walkways, pet beds, electrical cords, etc? Yes  Adequate lighting in your home to reduce risk of falls? Yes   ASSISTIVE DEVICES UTILIZED TO PREVENT FALLS:  Life alert? Yes  Use of a  cane, walker or w/c? No  Grab bars in the bathroom? Yes Shower chair or bench in shower? No  Elevated toilet seat or a handicapped toilet? No   TIMED UP AND GO:  Was the test performed? No . Audio Visit  Cognitive Function:        01/10/2023    2:03 PM 01/12/2022    1:23 PM  6CIT Screen  What Year? 0 points 0 points  What month? 0 points 0 points  What time? 0 points 0 points  Count back from 20 0 points 0 points  Months in reverse 0 points 0 points  Repeat phrase 4 points 0 points  Total Score 4 points 0 points    Immunizations Immunization History  Administered Date(s) Administered   Fluad Quad(high Dose 65+) 04/08/2022   Influenza-Unspecified 04/25/2021   PNEUMOCOCCAL CONJUGATE-20 08/02/2021   Tdap 01/15/2022   Zoster Recombinat (Shingrix) 11/24/2021, 05/21/2022    TDAP status: Up to date  Flu Vaccine status: Up to date  Pneumococcal vaccine status: Up to date  Covid-19 vaccine status: Completed vaccines  Qualifies for Shingles Vaccine? Yes   Zostavax completed Yes   Shingrix Completed?: Yes  Screening Tests Health Maintenance  Topic Date Due   DEXA SCAN  Never done   COVID-19 Vaccine (1) 01/26/2023 (Originally 04/25/1946)   INFLUENZA VACCINE  03/07/2023   Medicare Annual Wellness (AWV)  01/10/2024   DTaP/Tdap/Td (2 - Td or Tdap) 01/16/2032   Pneumonia Vaccine 68+ Years old  Completed   Zoster Vaccines- Shingrix  Completed   HPV VACCINES  Aged Out    Health Maintenance  Health Maintenance Due  Topic Date Due   DEXA SCAN  Never done    Colorectal cancer screening: No  longer required.   Mammogram status: No longer required due to Age.  Bone Density status: Ordered 01/10/23. Pt provided with contact info and advised to call to schedule appt.  Lung Cancer Screening: (Low Dose CT Chest recommended if Age 68-80 years, 30 pack-year currently smoking OR have quit w/in 15years.) does not qualify.     Additional Screening:  Hepatitis C Screening: does  not qualify; Completed   Vision Screening: Recommended annual ophthalmology exams for early detection of glaucoma and other disorders of the eye. Is the patient up to date with their annual eye exam?  Yes  Who is the provider or what is the name of the office in which the patient attends annual eye exams? [facility] If pt is not established with a provider, would they like to be referred to a provider to establish care? No .   Dental Screening: Recommended annual dental exams for proper oral hygiene  Community Resource Referral / Chronic Care Management:  CRR required this visit?  No   CCM required this visit?  No      Plan:     I have personally reviewed and noted the following in the patient's chart:   Medical and social history Use of alcohol, tobacco or illicit drugs  Current medications and supplements including opioid prescriptions. Patient is not currently taking opioid prescriptions. Functional ability and status Nutritional status Physical activity Advanced directives List of other physicians Hospitalizations, surgeries, and ER visits in previous 12 months Vitals Screenings to include cognitive, depression, and falls Referrals and appointments  In addition, I have reviewed and discussed with patient certain preventive protocols, quality metrics, and best practice recommendations. A written personalized care plan for preventive services as well as general preventive health recommendations were provided to patient.     Tillie Rung, LPN   08/11/1094   Nurse Notes: None

## 2023-01-10 NOTE — Patient Instructions (Addendum)
Nancy Duncan , Thank you for taking time to come for your Medicare Wellness Visit. I appreciate your ongoing commitment to your health goals. Please review the following plan we discussed and let me know if I can assist you in the future.   These are the goals we discussed:  Goals       My goal is to lose 10 pounds.      Stay Alive (pt-stated)        This is a list of the screening recommended for you and due dates:  Health Maintenance  Topic Date Due   DEXA scan (bone density measurement)  Never done   COVID-19 Vaccine (1) 01/26/2023*   Flu Shot  03/07/2023   Medicare Annual Wellness Visit  01/10/2024   DTaP/Tdap/Td vaccine (2 - Td or Tdap) 01/16/2032   Pneumonia Vaccine  Completed   Zoster (Shingles) Vaccine  Completed   HPV Vaccine  Aged Out  *Topic was postponed. The date shown is not the original due date.    Advanced directives: Please bring a copy of your health care power of attorney and living will to the office to be added to your chart at your convenience.   Conditions/risks identified: None  Next appointment: Follow up in one year for your annual wellness visit    Preventive Care 65 Years and Older, Female Preventive care refers to lifestyle choices and visits with your health care provider that can promote health and wellness. What does preventive care include? A yearly physical exam. This is also called an annual well check. Dental exams once or twice a year. Routine eye exams. Ask your health care provider how often you should have your eyes checked. Personal lifestyle choices, including: Daily care of your teeth and gums. Regular physical activity. Eating a healthy diet. Avoiding tobacco and drug use. Limiting alcohol use. Practicing safe sex. Taking low-dose aspirin every day. Taking vitamin and mineral supplements as recommended by your health care provider. What happens during an annual well check? The services and screenings done by your health care  provider during your annual well check will depend on your age, overall health, lifestyle risk factors, and family history of disease. Counseling  Your health care provider may ask you questions about your: Alcohol use. Tobacco use. Drug use. Emotional well-being. Home and relationship well-being. Sexual activity. Eating habits. History of falls. Memory and ability to understand (cognition). Work and work Astronomer. Reproductive health. Screening  You may have the following tests or measurements: Height, weight, and BMI. Blood pressure. Lipid and cholesterol levels. These may be checked every 5 years, or more frequently if you are over 13 years old. Skin check. Lung cancer screening. You may have this screening every year starting at age 54 if you have a 30-pack-year history of smoking and currently smoke or have quit within the past 15 years. Fecal occult blood test (FOBT) of the stool. You may have this test every year starting at age 64. Flexible sigmoidoscopy or colonoscopy. You may have a sigmoidoscopy every 5 years or a colonoscopy every 10 years starting at age 45. Hepatitis C blood test. Hepatitis B blood test. Sexually transmitted disease (STD) testing. Diabetes screening. This is done by checking your blood sugar (glucose) after you have not eaten for a while (fasting). You may have this done every 1-3 years. Bone density scan. This is done to screen for osteoporosis. You may have this done starting at age 13. Mammogram. This may be done every 1-2 years.  Talk to your health care provider about how often you should have regular mammograms. Talk with your health care provider about your test results, treatment options, and if necessary, the need for more tests. Vaccines  Your health care provider may recommend certain vaccines, such as: Influenza vaccine. This is recommended every year. Tetanus, diphtheria, and acellular pertussis (Tdap, Td) vaccine. You may need a Td  booster every 10 years. Zoster vaccine. You may need this after age 49. Pneumococcal 13-valent conjugate (PCV13) vaccine. One dose is recommended after age 38. Pneumococcal polysaccharide (PPSV23) vaccine. One dose is recommended after age 48. Talk to your health care provider about which screenings and vaccines you need and how often you need them. This information is not intended to replace advice given to you by your health care provider. Make sure you discuss any questions you have with your health care provider. Document Released: 08/19/2015 Document Revised: 04/11/2016 Document Reviewed: 05/24/2015 Elsevier Interactive Patient Education  2017 [employer].  Fall Prevention in the Home Falls can cause injuries. They can happen to people of all ages. There are many things you can do to make your home safe and to help prevent falls. What can I do on the outside of my home? Regularly fix the edges of walkways and driveways and fix any cracks. Remove anything that might make you trip as you walk through a door, such as a raised step or threshold. Trim any bushes or trees on the path to your home. Use bright outdoor lighting. Clear any walking paths of anything that might make someone trip, such as rocks or tools. Regularly check to see if handrails are loose or broken. Make sure that both sides of any steps have handrails. Any raised decks and porches should have guardrails on the edges. Have any leaves, snow, or ice cleared regularly. Use sand or salt on walking paths during winter. Clean up any spills in your garage right away. This includes oil or grease spills. What can I do in the bathroom? Use night lights. Install grab bars by the toilet and in the tub and shower. Do not use towel bars as grab bars. Use non-skid mats or decals in the tub or shower. If you need to sit down in the shower, use a plastic, non-slip stool. Keep the floor dry. Clean up any water that spills on the floor  as soon as it happens. Remove soap buildup in the tub or shower regularly. Attach bath mats securely with double-sided non-slip rug tape. Do not have throw rugs and other things on the floor that can make you trip. What can I do in the bedroom? Use night lights. Make sure that you have a light by your bed that is easy to reach. Do not use any sheets or blankets that are too big for your bed. They should not hang down onto the floor. Have a firm chair that has side arms. You can use this for support while you get dressed. Do not have throw rugs and other things on the floor that can make you trip. What can I do in the kitchen? Clean up any spills right away. Avoid walking on wet floors. Keep items that you use a lot in easy-to-reach places. If you need to reach something above you, use a strong step stool that has a grab bar. Keep electrical cords out of the way. Do not use floor polish or wax that makes floors slippery. If you must use wax, use non-skid floor wax.  Do not have throw rugs and other things on the floor that can make you trip. What can I do with my stairs? Do not leave any items on the stairs. Make sure that there are handrails on both sides of the stairs and use them. Fix handrails that are broken or loose. Make sure that handrails are as long as the stairways. Check any carpeting to make sure that it is firmly attached to the stairs. Fix any carpet that is loose or worn. Avoid having throw rugs at the top or bottom of the stairs. If you do have throw rugs, attach them to the floor with carpet tape. Make sure that you have a light switch at the top of the stairs and the bottom of the stairs. If you do not have them, ask someone to add them for you. What else can I do to help prevent falls? Wear shoes that: Do not have high heels. Have rubber bottoms. Are comfortable and fit you well. Are closed at the toe. Do not wear sandals. If you use a stepladder: Make sure that it is  fully opened. Do not climb a closed stepladder. Make sure that both sides of the stepladder are locked into place. Ask someone to hold it for you, if possible. Clearly mark and make sure that you can see: Any grab bars or handrails. First and last steps. Where the edge of each step is. Use tools that help you move around (mobility aids) if they are needed. These include: Canes. Walkers. Scooters. Crutches. Turn on the lights when you go into a dark area. Replace any light bulbs as soon as they burn out. Set up your furniture so you have a clear path. Avoid moving your furniture around. If any of your floors are uneven, fix them. If there are any pets around you, be aware of where they are. Review your medicines with your doctor. Some medicines can make you feel dizzy. This can increase your chance of falling. Ask your doctor what other things that you can do to help prevent falls. This information is not intended to replace advice given to you by your health care provider. Make sure you discuss any questions you have with your health care provider. Document Released: 05/19/2009 Document Revised: 12/29/2015 Document Reviewed: 08/27/2014 Elsevier Interactive Patient Education  2017 [employer].

## 2023-01-18 DIAGNOSIS — D0472 Carcinoma in situ of skin of left lower limb, including hip: Secondary | ICD-10-CM | POA: Diagnosis not present

## 2023-01-18 DIAGNOSIS — R42 Dizziness and giddiness: Secondary | ICD-10-CM | POA: Diagnosis not present

## 2023-01-23 ENCOUNTER — Encounter: Payer: Self-pay | Admitting: Emergency Medicine

## 2023-01-23 ENCOUNTER — Ambulatory Visit (INDEPENDENT_AMBULATORY_CARE_PROVIDER_SITE_OTHER): Payer: Medicare Other | Admitting: Emergency Medicine

## 2023-01-23 VITALS — BP 132/76 | HR 80 | Temp 98.2°F | Ht 65.0 in | Wt 182.1 lb

## 2023-01-23 DIAGNOSIS — R7303 Prediabetes: Secondary | ICD-10-CM | POA: Diagnosis not present

## 2023-01-23 DIAGNOSIS — I739 Peripheral vascular disease, unspecified: Secondary | ICD-10-CM

## 2023-01-23 DIAGNOSIS — K439 Ventral hernia without obstruction or gangrene: Secondary | ICD-10-CM | POA: Diagnosis not present

## 2023-01-23 DIAGNOSIS — Z9081 Acquired absence of spleen: Secondary | ICD-10-CM

## 2023-01-23 DIAGNOSIS — Z8673 Personal history of transient ischemic attack (TIA), and cerebral infarction without residual deficits: Secondary | ICD-10-CM | POA: Diagnosis not present

## 2023-01-23 LAB — COMPREHENSIVE METABOLIC PANEL
ALT: 13 U/L (ref 0–35)
AST: 19 U/L (ref 0–37)
Albumin: 4.3 g/dL (ref 3.5–5.2)
Alkaline Phosphatase: 65 U/L (ref 39–117)
BUN: 17 mg/dL (ref 6–23)
CO2: 26 mEq/L (ref 19–32)
Calcium: 9 mg/dL (ref 8.4–10.5)
Chloride: 103 mEq/L (ref 96–112)
Creatinine, Ser: 0.73 mg/dL (ref 0.40–1.20)
GFR: 75.88 mL/min (ref >60.00)
Glucose, Bld: 96 mg/dL (ref 70–99)
Potassium: 4.6 mEq/L (ref 3.5–5.1)
Sodium: 138 mEq/L (ref 135–145)
Total Bilirubin: 0.6 mg/dL (ref 0.2–1.2)
Total Protein: 7.5 g/dL (ref 6.0–8.3)

## 2023-01-23 LAB — LIPID PANEL
Cholesterol: 278 mg/dL — ABNORMAL HIGH (ref 0–200)
HDL: 62.7 mg/dL (ref >39.00)
LDL Cholesterol: 179 mg/dL — ABNORMAL HIGH (ref 0–99)
NonHDL: 215.18
Total CHOL/HDL Ratio: 4
Triglycerides: 180 mg/dL — ABNORMAL HIGH (ref 0.0–149.0)
VLDL: 36 mg/dL (ref 0.0–40.0)

## 2023-01-23 LAB — CBC WITH DIFFERENTIAL/PLATELET
Basophils Absolute: 0.1 10*3/uL (ref 0.0–0.1)
Basophils Relative: 0.9 % (ref 0.0–3.0)
Eosinophils Absolute: 0.3 10*3/uL (ref 0.0–0.7)
Eosinophils Relative: 3.8 % (ref 0.0–5.0)
HCT: 41.2 % (ref 36.0–46.0)
Hemoglobin: 13.7 g/dL (ref 12.0–15.0)
Lymphocytes Relative: 32.9 % (ref 12.0–46.0)
Lymphs Abs: 2.8 10*3/uL (ref 0.7–4.0)
MCHC: 33.2 g/dL (ref 30.0–36.0)
MCV: 97.8 fl (ref 78.0–100.0)
Monocytes Absolute: 0.9 10*3/uL (ref 0.1–1.0)
Monocytes Relative: 10.6 % (ref 3.0–12.0)
Neutro Abs: 4.4 10*3/uL (ref 1.4–7.7)
Neutrophils Relative %: 51.8 % (ref 43.0–77.0)
Platelets: 296 10*3/uL (ref 150.0–400.0)
RBC: 4.21 Mil/uL (ref 3.87–5.11)
RDW: 13.7 % (ref 11.5–15.5)
WBC: 8.5 10*3/uL (ref 4.0–10.5)

## 2023-01-23 LAB — HEMOGLOBIN A1C: Hgb A1c MFr Bld: 5.7 % (ref 4.6–6.5)

## 2023-01-23 NOTE — Assessment & Plan Note (Signed)
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  Was advised against surgery.

## 2023-01-23 NOTE — Assessment & Plan Note (Signed)
Clinically stable. Continues daily baby aspirin No signs of severe peripheral circulatory insufficiency

## 2023-01-23 NOTE — Progress Notes (Signed)
Nancy Duncan 82 y.o.   Chief Complaint  Patient presents with   Medical Management of Chronic Issues    f/u appt, patient has several issues/concerns to discuss     HISTORY OF PRESENT ILLNESS: This is a 82 y.o. female here for follow-up of chronic medical conditions Has questions pertaining to the following: 1.  History of ventral hernia.  Recently seen by general surgeon.  No surgery recommended 2.  History of splenectomy.  Questions about infections 3.  History of TIA with carotid artery disease.  Takes daily baby aspirin 4.  History of vertigo.  Recently had Epley maneuver administered with good results No other complaints or medical concerns today. Also requesting blood work  HPI   Prior to Admission medications   Medication Sig Start Date End Date Taking? Authorizing Provider  alendronate (FOSAMAX) 70 MG tablet TAKE ONE TABLET BY MOUTH ONCE A WEEK 09/15/22  Yes [name], MD  ASPIRIN 81 PO Take by mouth.   Yes [provider]  citalopram (CELEXA) 10 MG tablet TAKE 1 TABLET BY MOUTH DAILY 01/05/23  Yes Sandee Bernath, [name], MD  meclizine (ANTIVERT) 12.5 MG tablet Take 1 tablet (12.5 mg total) by mouth 3 (three) times daily as needed for dizziness. 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   Bilateral carpal tunnel syndrome 08/16/2021   History of TIA (transient ischemic attack) 08/02/2021   Pancreatic tumor 08/02/2021   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   Ventral hernia without obstruction or gangrene 08/02/2021   TIA (transient ischemic attack) 06/15/2021    Past Medical History:  Diagnosis Date   Breast cancer (HCC)    Cancer (HCC)    Breast Cancer   Pancreatic tumor    Personal history of  radiation therapy     Past Surgical History:  Procedure Laterality Date   APPENDECTOMY     BREAST LUMPECTOMY     SPLENECTOMY, TOTAL      Social History   Socioeconomic History   Marital status: Widowed    Spouse name: Not on file   Number of children: 3   Years of education: Not on file   Highest education level: Master's degree (e.g., MA, MS, MEng, MEd, MSW, MBA)  Occupational History   Not on file  Tobacco Use   Smoking status: Former    Types: Cigarettes   Smokeless tobacco: Never  Vaping Use   Vaping Use: Never used  Substance and Sexual Activity   Alcohol use: Yes    Comment: rare   Drug use: Never   Sexual activity: Not on file  Other Topics Concern   Not on file  Social History Narrative   Right handed    Caffeine  cup per day   Lives at home alone    Social Determinants of Health   Financial Resource Strain: Low Risk  (01/19/2023)   Overall Financial Resource Strain (CARDIA)    Difficulty of Paying Living Expenses: Not hard at all  Food Insecurity: No Food Insecurity (01/19/2023)   Hunger Vital Sign    Worried About Running Out of Food in the Last Year: Never true    Ran Out of Food in the Last Year: Never true  Transportation Needs: No Transportation Needs (01/19/2023)  PRAPARE - Administrator, Civil Service (Medical): No    Lack of Transportation (Non-Medical): No  Physical Activity: Unknown (01/19/2023)   Exercise Vital Sign    Days of Exercise per Week: Patient declined    Minutes of Exercise per Session: 30 min  Recent Concern: Physical Activity - Insufficiently Active (01/10/2023)   Exercise Vital Sign    Days of Exercise per Week: 4 days    Minutes of Exercise per Session: 30 min  Stress: No Stress Concern Present (01/19/2023)   [employer] of Occupational Health - Occupational Stress Questionnaire    Feeling of Stress : Only a little  Social Connections: Unknown (01/19/2023)   Social Connection and Isolation Panel [NHANES]     Frequency of Communication with Friends and Family: Twice a week    Frequency of Social Gatherings with Friends and Family: More than three times a week    Attends Religious Services: Patient declined    Database administrator or Organizations: Yes    Attends Banker Meetings: More than 4 times per year    Marital Status: Widowed  Intimate Partner Violence: Not At Risk (01/10/2023)   Humiliation, Afraid, Rape, and Kick questionnaire    Fear of Current or Ex-Partner: No    Emotionally Abused: No    Physically Abused: No    Sexually Abused: No    Family History  Problem Relation Age of Onset   Cancer - Ovarian Mother    Arthritis Father    Alzheimer's disease Sister    Arthritis Sister      Review of Systems  Constitutional: Negative.  Negative for chills and fever.  HENT: Negative.  Negative for congestion and sore throat.   Respiratory: Negative.  Negative for cough and shortness of breath.   Cardiovascular: Negative.  Negative for chest pain and palpitations.  Gastrointestinal:  Negative for abdominal pain, diarrhea, nausea and vomiting.  Genitourinary: Negative.  Negative for dysuria and hematuria.  Skin: Negative.  Negative for rash.  Neurological: Negative.  Negative for dizziness and headaches.  All other systems reviewed and are negative.   Vitals:   01/23/23 0955  BP: 132/76  Pulse: 80  Temp: 98.2 F (36.8 C)  SpO2: 98%    Physical Exam Vitals reviewed.  Constitutional:      Appearance: Normal appearance.  HENT:     Head: Normocephalic.     Mouth/Throat:     Mouth: Mucous membranes are moist.     Pharynx: Oropharynx is clear.  Eyes:     Extraocular Movements: Extraocular movements intact.     Pupils: Pupils are equal, round, and reactive to light.  Cardiovascular:     Rate and Rhythm: Normal rate and regular rhythm.     Pulses: Normal pulses.     Heart sounds: Normal heart sounds.  Pulmonary:     Effort: Pulmonary effort is normal.      Breath sounds: Normal breath sounds.  Abdominal:     Palpations: Abdomen is soft.     Tenderness: There is no abdominal tenderness.     Hernia: A hernia (Large nontender ventral hernia) is present.  Musculoskeletal:     Cervical back: No tenderness.  Lymphadenopathy:     Cervical: No cervical adenopathy.  Skin:    General: Skin is warm and dry.     Capillary Refill: Capillary refill takes less than 2 seconds.  Neurological:     General: No focal deficit present.     Mental  Status: She is alert and oriented to person, place, and time.  Psychiatric:        Mood and Affect: Mood normal.        Behavior: Behavior normal.      ASSESSMENT & PLAN: A total of 44 minutes was spent with the patient and counseling/coordination of care regarding preparing for this visit, review of most recent office visit notes, review of multiple chronic medical conditions under management, review of most recent blood work results, review of all medications, education on nutrition, prognosis, documentation, and need for follow-up.  Problem List Items Addressed This Visit       Cardiovascular and Mediastinum   Peripheral artery disease (HCC)    Clinically stable. 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  Was advised against surgery.      History of splenectomy    No recent infections. Stable.  No concerns.      Ventral hernia without obstruction or gangrene - Primary    Large ventral hernia.  Asymptomatic. Recently evaluated by general surgeon. No surgery recommended ED precautions given Advice regarding signs and symptoms of bowel obstruction given.      Relevant Orders   CBC  with Differential/Platelet   Comprehensive metabolic panel   Other Visit Diagnoses     Prediabetes       Relevant Orders   Hemoglobin A1c      Patient Instructions  Health Maintenance After Age 85 After age 77, you are at a higher risk for certain long-term diseases and infections as well as injuries from falls. Falls are a major cause of broken bones and head injuries in people who are older than age 85. Getting regular preventive care can help to keep you healthy and well. Preventive care includes getting regular testing and making lifestyle changes as recommended by your health care provider. Talk with your health care provider about: Which screenings and tests you should have. A screening is a test that checks for a disease when you have no symptoms. A diet and exercise plan that is right for you. What should I know about screenings and tests to prevent falls? Screening and testing are the best ways to find a health problem early. Early diagnosis and treatment give you the best chance of managing medical conditions that are common after age 85. Certain conditions and lifestyle choices may make you more likely to have a fall. Your health care provider may recommend: Regular vision checks. Poor vision and conditions such as cataracts can make you more likely to have a fall. If you wear glasses, make sure to get your prescription updated if your vision changes. Medicine review. Work with your health care provider to regularly review all of the medicines you are taking, including over-the-counter medicines. Ask your health care provider about any side effects that may make you more likely to have a fall. Tell your health care provider if any medicines that you take make you feel dizzy or sleepy. Strength and balance checks. Your health care provider may recommend certain tests to check your strength and balance while standing, walking, or changing positions. Foot health exam. Foot pain and  numbness, as  well as not wearing proper footwear, can make you more likely to have a fall. Screenings, including: Osteoporosis screening. Osteoporosis is a condition that causes the bones to get weaker and break more easily. Blood pressure screening. Blood pressure changes and medicines to control blood pressure can make you feel dizzy. Depression screening. You may be more likely to have a fall if you have a fear of falling, feel depressed, or feel unable to do activities that you used to do. Alcohol use screening. Using too much alcohol can affect your balance and may make you more likely to have a fall. Follow these instructions at home: Lifestyle Do not drink alcohol if: Your health care provider tells you not to drink. If you drink alcohol: Limit how much you have to: 0-1 drink a day for women. 0-2 drinks a day for men. Know how much alcohol is in your drink. In the [place] one drink equals one 12 oz bottle of beer (355 mL), one 5 oz glass of wine (148 mL), or one 1 oz glass of hard liquor (44 mL). Do not use any products that contain nicotine or tobacco. These products include cigarettes, chewing tobacco, and vaping devices, such as e-cigarettes. If you need help quitting, ask your health care provider. Activity  Follow a regular exercise program to stay fit. This will help you maintain your balance. Ask your health care provider what types of exercise are appropriate for you. If you need a cane or walker, use it as recommended by your health care provider. Wear supportive shoes that have nonskid soles. Safety  Remove any tripping hazards, such as rugs, cords, and clutter. Install safety equipment such as grab bars in bathrooms and safety rails on stairs. Keep rooms and walkways well-lit. General instructions Talk with your health care provider about your risks for falling. Tell your health care provider if: You fall. Be sure to tell your health care provider about all falls, even  ones that seem minor. You feel dizzy, tiredness (fatigue), or off-balance. Take over-the-counter and prescription medicines only as told by your health care provider. These include supplements. Eat a healthy diet and maintain a healthy weight. A healthy diet includes low-fat dairy products, low-fat (lean) meats, and fiber from whole grains, beans, and lots of fruits and vegetables. Stay current with your vaccines. Schedule regular health, dental, and eye exams. Summary Having a healthy lifestyle and getting preventive care can help to protect your health and wellness after age 105. Screening and testing are the best way to find a health problem early and help you avoid having a fall. Early diagnosis and treatment give you the best chance for managing medical conditions that are more common for people who are older than age 21. Falls are a major cause of broken bones and head injuries in people who are older than age 36. Take precautions to prevent a fall at home. Work with your health care provider to learn what changes you can make to improve your health and wellness and to prevent falls. This information is not intended to replace advice given to you by your health care provider. Make sure you discuss any questions you have with your health care provider. Document Revised: 12/12/2020 Document Reviewed: 12/12/2020 Elsevier Patient Education  2024 [employer]    Edwina Barth, MD [facility]

## 2023-01-23 NOTE — Assessment & Plan Note (Signed)
No recent infections. Stable.  No concerns.

## 2023-01-23 NOTE — Patient Instructions (Signed)
Health Maintenance After Age 84 After age 84, you are at a higher risk for certain long-term diseases and infections as well as injuries from falls. Falls are a major cause of broken bones and head injuries in people who are older than age 84. Getting regular preventive care can help to keep you healthy and well. Preventive care includes getting regular testing and making lifestyle changes as recommended by your health care provider. Talk with your health care provider about: Which screenings and tests you should have. A screening is a test that checks for a disease when you have no symptoms. A diet and exercise plan that is right for you. What should I know about screenings and tests to prevent falls? Screening and testing are the best ways to find a health problem early. Early diagnosis and treatment give you the best chance of managing medical conditions that are common after age 84. Certain conditions and lifestyle choices may make you more likely to have a fall. Your health care provider may recommend: Regular vision checks. Poor vision and conditions such as cataracts can make you more likely to have a fall. If you wear glasses, make sure to get your prescription updated if your vision changes. Medicine review. Work with your health care provider to regularly review all of the medicines you are taking, including over-the-counter medicines. Ask your health care provider about any side effects that may make you more likely to have a fall. Tell your health care provider if any medicines that you take make you feel dizzy or sleepy. Strength and balance checks. Your health care provider may recommend certain tests to check your strength and balance while standing, walking, or changing positions. Foot health exam. Foot pain and numbness, as well as not wearing proper footwear, can make you more likely to have a fall. Screenings, including: Osteoporosis screening. Osteoporosis is a condition that causes  the bones to get weaker and break more easily. Blood pressure screening. Blood pressure changes and medicines to control blood pressure can make you feel dizzy. Depression screening. You may be more likely to have a fall if you have a fear of falling, feel depressed, or feel unable to do activities that you used to do. Alcohol use screening. Using too much alcohol can affect your balance and may make you more likely to have a fall. Follow these instructions at home: Lifestyle Do not drink alcohol if: Your health care provider tells you not to drink. If you drink alcohol: Limit how much you have to: 0-1 drink a day for women. 0-2 drinks a day for men. Know how much alcohol is in your drink. In the [place] one drink equals one 12 oz bottle of beer (355 mL), one 5 oz glass of wine (148 mL), or one 1 oz glass of hard liquor (44 mL). Do not use any products that contain nicotine or tobacco. These products include cigarettes, chewing tobacco, and vaping devices, such as e-cigarettes. If you need help quitting, ask your health care provider. Activity  Follow a regular exercise program to stay fit. This will help you maintain your balance. Ask your health care provider what types of exercise are appropriate for you. If you need a cane or walker, use it as recommended by your health care provider. Wear supportive shoes that have nonskid soles. Safety  Remove any tripping hazards, such as rugs, cords, and clutter. Install safety equipment such as grab bars in bathrooms and safety rails on stairs. Keep rooms and walkways   well-lit. General instructions Talk with your health care provider about your risks for falling. Tell your health care provider if: You fall. Be sure to tell your health care provider about all falls, even ones that seem minor. You feel dizzy, tiredness (fatigue), or off-balance. Take over-the-counter and prescription medicines only as told by your health care provider. These include  supplements. Eat a healthy diet and maintain a healthy weight. A healthy diet includes low-fat dairy products, low-fat (lean) meats, and fiber from whole grains, beans, and lots of fruits and vegetables. Stay current with your vaccines. Schedule regular health, dental, and eye exams. Summary Having a healthy lifestyle and getting preventive care can help to protect your health and wellness after age 84. Screening and testing are the best way to find a health problem early and help you avoid having a fall. Early diagnosis and treatment give you the best chance for managing medical conditions that are more common for people who are older than age 84. Falls are a major cause of broken bones and head injuries in people who are older than age 84. Take precautions to prevent a fall at home. Work with your health care provider to learn what changes you can make to improve your health and wellness and to prevent falls. This information is not intended to replace advice given to you by your health care provider. Make sure you discuss any questions you have with your health care provider. Document Revised: 12/12/2020 Document Reviewed: 12/12/2020 Elsevier Patient Education  2024 Elsevier Inc.  

## 2023-01-23 NOTE — Assessment & Plan Note (Addendum)
Large ventral hernia.  Asymptomatic. Recently evaluated by general surgeon. No surgery recommended ED precautions given Advice regarding signs and symptoms of bowel obstruction given.

## 2023-01-29 DIAGNOSIS — H10023 Other mucopurulent conjunctivitis, bilateral: Secondary | ICD-10-CM | POA: Diagnosis not present

## 2023-03-04 ENCOUNTER — Other Ambulatory Visit: Payer: Self-pay | Admitting: Neurology

## 2023-03-04 ENCOUNTER — Encounter: Payer: Self-pay | Admitting: Neurology

## 2023-03-04 ENCOUNTER — Other Ambulatory Visit: Payer: Self-pay

## 2023-03-04 DIAGNOSIS — R208 Other disturbances of skin sensation: Secondary | ICD-10-CM

## 2023-03-04 DIAGNOSIS — G629 Polyneuropathy, unspecified: Secondary | ICD-10-CM

## 2023-03-18 ENCOUNTER — Encounter: Payer: Self-pay | Admitting: Neurology

## 2023-03-28 ENCOUNTER — Encounter: Payer: Self-pay | Admitting: Neurology

## 2023-03-28 ENCOUNTER — Ambulatory Visit: Payer: Medicare Other | Admitting: Neurology

## 2023-03-28 VITALS — BP 141/73 | HR 81 | Ht 65.0 in | Wt 181.5 lb

## 2023-03-28 DIAGNOSIS — G629 Polyneuropathy, unspecified: Secondary | ICD-10-CM

## 2023-03-28 DIAGNOSIS — R29898 Other symptoms and signs involving the musculoskeletal system: Secondary | ICD-10-CM

## 2023-03-28 DIAGNOSIS — E785 Hyperlipidemia, unspecified: Secondary | ICD-10-CM | POA: Diagnosis not present

## 2023-03-28 DIAGNOSIS — R208 Other disturbances of skin sensation: Secondary | ICD-10-CM

## 2023-03-28 DIAGNOSIS — R42 Dizziness and giddiness: Secondary | ICD-10-CM

## 2023-03-28 DIAGNOSIS — I6529 Occlusion and stenosis of unspecified carotid artery: Secondary | ICD-10-CM

## 2023-03-28 MED ORDER — COLESEVELAM HCL 625 MG PO TABS: 1250.0000 mg | ORAL_TABLET | Freq: Two times a day (BID) | ORAL | 11 refills | Status: DC

## 2023-03-28 MED ORDER — GABAPENTIN 100 MG PO CAPS: ORAL_CAPSULE | ORAL | 0 refills | Status: DC

## 2023-03-28 NOTE — Progress Notes (Signed)
[facility]  PATIENT: Nancy Duncan DOB: 05-26-1941  REFERRING DOCTOR OR PCP: Alvester Chou MD  SOURCE: Patient, notes from primary care  _________________________________   HISTORICAL  CHIEF COMPLAINT:  Chief Complaint  Patient presents with   Follow-up    Patient in room #2 and alone. Patient states she still has tightness in both legs and feet.    HISTORY OF PRESENT ILLNESS:  Nancy Duncan is a 82 y.o. woman woth vertigo and a burning sensation in her feet.     UPDATE 08/23/2022 She has had a tight sensational in legs and numbness in feet.   She has had a vascular evaluation which was fine.     She has a dry mouth but not dry eyes.    She has no rashesSymptoms started many years ago and she has taken vitamins.   Symptoms worsened more recently   She denies Duncan weakness  She reports a dry mouth.    We tried alpha lipoic acid and it initially helped but not long term.      Currently, she reports vertigo.  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   She tried ear wax drops but they had not helped.   Vertigo is nearly constant woth fluctuations.  It is rotational.  She does not veer when she walks.   Hearing is fine.    Right ear feels stuffed up.   She had impacted wx in past with similar feeling.     She has hyperlipidemia and was on a statin but had she so stopped.  She has occluded right ICA.  Has not been placed on alternative.    The arm weakness from the PION has improved to baseline.     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  She had amaurosis fugax on the right leading to additional tests.     Lipitor was prescribed but she stopped and we discussed her getting back on.     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    She denies neck pain.  No urinary urgency or changes.    She denies numbness.    No neck, arm or axillary.      I personally  reviewed the MRI of the brain from 06/08/2021.  It shows minimal age appropriate generalized cortical atrophy and mild chronic microvascular ischemic changes in the pons and hemispheres.  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  LABS 08/2022:  Hgb A1c was 5.7, SPEPIEF, B12, antiHu/Yo, Copper, RF were neg or normal.    REVIEW OF SYSTEMS: Constitutional: No fevers, chills, sweats, or change in appetite Eyes: No visual changes, double vision, eye pain Ear, nose and throat: No hearing loss, ear pain, nasal congestion, sore throat Cardiovascular: No chest pain, palpitations Respiratory:  No shortness of breath at rest or with exertion.   No wheezes GastrointestinaI: No nausea, vomiting, diarrhea, abdominal pain, fecal incontinence Genitourinary:  No dysuria, urinary retention or frequency.  No nocturia. Musculoskeletal:  No neck pain, back pain Integumentary: No rash, pruritus, skin lesions Neurological: as above Psychiatric: No depression at this time.  No anxiety Endocrine: No palpitations, diaphoresis, change in appetite, change in weigh or increased thirst Hematologic/Lymphatic:  No anemia, purpura, petechiae. Allergic/Immunologic: No itchy/runny eyes, nasal congestion, recent allergic reactions, rashes  ALLERGIES: Allergies  Allergen Reactions   Erythromycin Swelling    HOME  MEDICATIONS:  Current Outpatient Medications:    alendronate (FOSAMAX) 70 MG tablet, TAKE ONE TABLET BY MOUTH ONCE A WEEK, Disp: 84 tablet, Rfl: 1   ASPIRIN 81 PO, Take by mouth., Disp: , Rfl:    citalopram (CELEXA) 10 MG tablet, TAKE 1 TABLET BY MOUTH DAILY, Disp: 90 tablet, Rfl: 2   colesevelam (WELCHOL) 625 MG tablet, Take 2 tablets (1,250 mg total) by mouth 2 (two) times daily with a meal., Disp: 120 tablet, Rfl: 11   gabapentin (NEURONTIN) 100 MG capsule, One po qAM, one po qPM and two po qHS, Disp: 120 capsule, Rfl: 0   meclizine (ANTIVERT) 12.5 MG tablet, Take 1 tablet (12.5 mg total) by mouth 3  (three) times daily as needed for dizziness., Disp: 30 tablet, Rfl: 1  PAST MEDICAL HISTORY: Past Medical History:  Diagnosis Date   Breast cancer (HCC)    Cancer (HCC)    Breast Cancer   Pancreatic tumor    Personal history of radiation therapy     PAST SURGICAL HISTORY: Past Surgical History:  Procedure Laterality Date   APPENDECTOMY     BREAST LUMPECTOMY     SPLENECTOMY, TOTAL      FAMILY HISTORY: Family History  Problem Relation Age of Onset   Cancer - Ovarian Mother    Arthritis Father    Alzheimer's disease Sister    Arthritis Sister     SOCIAL HISTORY:  Social History   Socioeconomic History   Marital status: Widowed    Spouse name: Not on file   Number of children: 3   Years of education: Not on file   Highest education level: Master's degree (e.g., MA, MS, MEng, MEd, MSW, MBA)  Occupational History   Not on file  Tobacco Use   Smoking status: Former    Types: Cigarettes   Smokeless tobacco: Never  Vaping Use   Vaping status: Never Used  Substance and Sexual Activity   Alcohol use: Yes    Comment: rare   Drug use: Never   Sexual activity: Not on file  Other Topics Concern   Not on file  Social History Narrative   Right handed    Caffeine  cup per day   Lives at home alone    Social Determinants of Health   Financial Resource Strain: Low Risk  (01/19/2023)   Overall Financial Resource Strain (CARDIA)    Difficulty of Paying Living Expenses: Not hard at all  Food Insecurity: No Food Insecurity (01/19/2023)   Hunger Vital Sign    Worried About Running Out of Food in the Last Year: Never true    Ran Out of Food in the Last Year: Never true  Transportation Needs: No Transportation Needs (01/19/2023)   PRAPARE - Administrator, Civil Service (Medical): No    Lack of Transportation (Non-Medical): No  Physical Activity: Unknown (01/19/2023)   Exercise Vital Sign    Days of Exercise per Week: Patient declined    Minutes of Exercise per  Session: 30 min  Recent Concern: Physical Activity - Insufficiently Active (01/10/2023)   Exercise Vital Sign    Days of Exercise per Week: 4 days    Minutes of Exercise per Session: 30 min  Stress: No Stress Concern Present (01/19/2023)   [employer] of Occupational Health - Occupational Stress Questionnaire    Feeling of Stress : Only a little  Social Connections: Unknown (01/19/2023)   Social Connection and Isolation Panel [NHANES]    Frequency of Communication with  Friends and Family: Twice a week    Frequency of Social Gatherings with Friends and Family: More than three times a week    Attends Religious Services: Patient declined    Database administrator or Organizations: Yes    Attends Banker Meetings: More than 4 times per year    Marital Status: Widowed  Intimate Partner Violence: Not At Risk (01/10/2023)   Humiliation, Afraid, Rape, and Kick questionnaire    Fear of Current or Ex-Partner: No    Emotionally Abused: No    Physically Abused: No    Sexually Abused: No     PHYSICAL EXAM  Vitals:   03/28/23 0930  BP: (!) 141/73  Pulse: 81  Weight: 181 lb 8 oz (82.3 kg)  Height: 5\' 5"  (1.651 m)    Body mass index is 30.2 kg/m.   General: The patient is well-developed and well-nourished and in no acute distress  HEENT:  Head is Stephens/AT.  Sclera are anicteric.   There was increase cerumen on the right and inflammation of the ear canal.  I was unable to visualize the right tympanic membrane.  The ear canal and tympanic membrane were normal on the left  Skin: Extremities are without rash or  edema.  Musculoskeletal:  Back is nontender  Neurologic Exam  Mental status: The patient is alert and oriented x 3 at the time of the examination. The patient has apparent normal recent and remote memory, with an apparently normal attention span and concentration ability.   Speech is normal.  Cranial nerves: Extraocular movements are full.  Facial strength and  sensation was normal.  Hearing was slightly asymmetric.  Motor:  Muscle bulk is normal.   Tone is normal. Strength is  5 / 5 in the right hand/arm and both legs.  Strength was 5/5 in all proximal muscles of the left arm and in the finger flexors, wrist flexors and pronators.  Strength was 4+/5 in the wrist extensors 4/5 in finger extensors and 4/5 in ulnar and median innervated hand muscles  Sensory: Sensory testing is intact to pinprick, soft touch and vibration sensation in the arms.  In the legs, vibration sensation was normal at the knees, 80% at the ankles and 20% at the toes.  Pinprick sensation was normal in the feet and legs.  Coordination: Cerebellar testing reveals good finger-nose-finger and heel-to-shin bilaterally.  Gait and station: Station is normal.   Gait is normal. Tandem gait is mildly wide but probably normal for age. Romberg is negative.   Reflexes: Deep tendon reflexes are symmetric and normal in the arms and absent in the legs.   Plantar responses are flexor.    DIAGNOSTIC DATA (LABS, IMAGING, TESTING) - I reviewed patient records, labs, notes, testing and imaging myself where available.  Lab Results  Component Value Date   WBC 8.5 01/23/2023   HGB 13.7 01/23/2023   HCT 41.2 01/23/2023   MCV 97.8 01/23/2023   PLT 296.0 01/23/2023      Component Value Date/Time   NA 138 01/23/2023 1045   K 4.6 01/23/2023 1045   CL 103 01/23/2023 1045   CO2 26 01/23/2023 1045   GLUCOSE 96 01/23/2023 1045   BUN 17 01/23/2023 1045   CREATININE 0.73 01/23/2023 1045   CALCIUM 9.0 01/23/2023 1045   PROT 7.5 01/23/2023 1045   PROT 6.9 08/23/2022 0927   ALBUMIN 4.3 01/23/2023 1045   AST 19 01/23/2023 1045   ALT 13 01/23/2023 1045   ALKPHOS 65  01/23/2023 1045   BILITOT 0.6 01/23/2023 1045   GFRNONAA 48 (L) 06/08/2021 1435        ASSESSMENT AND PLAN  Polyneuropathy  Dysesthesia  Vertigo  Left arm weakness  Hyperlipidemia, unspecified hyperlipidemia  type  Gabapentin for dysesthesias If neuropathic symptoms worsen, will check NCV/EMG She has ICA occlusion and elevated lipids.   Could not tolerate a statin so I sent in a script for Welchol.  Continue ASA Rtc 6 months  Nancy Craigo A. Epimenio Foot, MD, [facility] 03/28/2023, 10:05 AM Certified in Neurology, Clinical Neurophysiology, Sleep Medicine and Neuroimaging  [facility] [address] [place], [place] [address] [phone]

## 2023-04-02 DIAGNOSIS — K432 Incisional hernia without obstruction or gangrene: Secondary | ICD-10-CM | POA: Diagnosis not present

## 2023-05-01 DIAGNOSIS — Z08 Encounter for follow-up examination after completed treatment for malignant neoplasm: Secondary | ICD-10-CM | POA: Diagnosis not present

## 2023-05-01 DIAGNOSIS — L814 Other melanin hyperpigmentation: Secondary | ICD-10-CM | POA: Diagnosis not present

## 2023-05-01 DIAGNOSIS — Z85828 Personal history of other malignant neoplasm of skin: Secondary | ICD-10-CM | POA: Diagnosis not present

## 2023-05-01 DIAGNOSIS — D225 Melanocytic nevi of trunk: Secondary | ICD-10-CM | POA: Diagnosis not present

## 2023-05-01 DIAGNOSIS — L821 Other seborrheic keratosis: Secondary | ICD-10-CM | POA: Diagnosis not present

## 2023-05-06 ENCOUNTER — Ambulatory Visit: Payer: Self-pay | Admitting: Neurology

## 2023-05-06 ENCOUNTER — Ambulatory Visit (INDEPENDENT_AMBULATORY_CARE_PROVIDER_SITE_OTHER): Payer: Medicare Other | Admitting: Neurology

## 2023-05-06 DIAGNOSIS — G629 Polyneuropathy, unspecified: Secondary | ICD-10-CM

## 2023-05-06 DIAGNOSIS — R208 Other disturbances of skin sensation: Secondary | ICD-10-CM

## 2023-05-06 DIAGNOSIS — Z0289 Encounter for other administrative examinations: Secondary | ICD-10-CM

## 2023-05-06 MED ORDER — LAMOTRIGINE 25 MG PO TABS
ORAL_TABLET | ORAL | 11 refills | Status: DC
Start: 2023-05-06 — End: 2023-06-26

## 2023-05-06 NOTE — Patient Instructions (Addendum)
Meds ordered this encounter  Medications   lamoTRIgine (LAMICTAL) 25 MG tablet    Sig: Start with one tab(25mg ) in the morning for one week. Then 25mg  (1 tab) twice daily for one week. Then 2 tabs in the morning and one at night for one week. Then 2 tabs(50mg ) in the morning and 2 tabs(50mg ) at bedtime.    Dispense:  120 tablet    Refill:  11   Stop gabapentin  Lamotrigine Tablets What is this medication? LAMOTRIGINE (la MOE Patrecia Pace) prevents and controls seizures in people with epilepsy. It may also be used to treat bipolar disorder. It works by calming overactive nerves in your body. This medicine may be used for other purposes; ask your health care provider or pharmacist if you have questions. COMMON BRAND NAME(S): Lamictal, Subvenite What should I tell my care team before I take this medication? They need to know if you have any of these conditions: Heart disease History of irregular heartbeat Immune system problems Kidney disease Liver disease Low levels of folic acid in the blood Lupus Mental health condition Suicidal thoughts, plans, or attempt by you or a family member An unusual or allergic reaction to lamotrigine, other medications, foods, dyes, or preservatives Pregnant or trying to get pregnant Breastfeeding How should I use this medication? Take this medication by mouth with a glass of water. Follow the directions on the prescription label. Do not chew these tablets. If this medication upsets your stomach, take it with food or milk. Take your doses at regular intervals. Do not take your medication more often than directed. A special MedGuide will be given to you by the pharmacist with each new prescription and refill. Be sure to read this information carefully each time. Talk to your care team about the use of this medication in children. While this medication may be prescribed for children as young as 2 years for selected conditions, precautions do apply. Overdosage: If  you think you have taken too much of this medicine contact a poison control center or emergency room at once. NOTE: This medicine is only for you. Do not share this medicine with others. What if I miss a dose? If you miss a dose, take it as soon as you can. If it is almost time for your next dose, take only that dose. Do not take double or extra doses. What may interact with this medication? Atazanavir Certain medications for irregular heartbeat Certain medications for seizures, such as carbamazepine, phenobarbital, phenytoin, primidone, or valproic acid Estrogen or progestin hormones Lopinavir Rifampin Ritonavir This list may not describe all possible interactions. Give your health care provider a list of all the medicines, herbs, non-prescription drugs, or dietary supplements you use. Also tell them if you smoke, drink alcohol, or use illegal drugs. Some items may interact with your medicine. What should I watch for while using this medication? Visit your care team for regular checks on your progress. If you take this medication for seizures, wear a Medic Alert bracelet or necklace. Carry an identification card with information about your condition, medications, and care team. It is important to take this medication exactly as directed. When first starting treatment, your dose will need to be adjusted slowly. It may take weeks or months before your dose is stable. You should contact your care team if your seizures get worse or if you have any new types of seizures. Do not stop taking this medication unless instructed by your care team. Stopping your medication suddenly can  increase your seizures or their severity. This medication may cause serious skin reactions. They can happen weeks to months after starting the medication. Contact your care team right away if you notice fevers or flu-like symptoms with a rash. The rash may be red or purple and then turn into blisters or peeling of the skin. You  may also notice a red rash with swelling of the face, lips, or lymph nodes in your neck or under your arms. This medication may affect your coordination, reaction time, or judgment. Do not drive or operate machinery until you know how this medication affects you. Sit up or stand slowly to reduce the risk of dizzy or fainting spells. Drinking alcohol with this medication can increase the risk of these side effects. If you are taking this medication for bipolar disorder, it is important to report any changes in your mood to your care team. If your condition gets worse, you get mentally depressed, feel very hyperactive or manic, have difficulty sleeping, or have thoughts of hurting yourself or committing suicide, you need to get help from your care team right away. If you are a caregiver for someone taking this medication for bipolar disorder, you should also report these behavioral changes right away. The use of this medication may increase the chance of suicidal thoughts or actions. Pay special attention to how you are responding while on this medication. Your mouth may get dry. Chewing sugarless gum or sucking hard candy and drinking plenty of water may help. Contact your care team if the problem does not go away or is severe. If you become pregnant while using this medication, you may enroll in the [place] American Antiepileptic Drug Pregnancy Registry by calling [phone]. This registry collects information about the safety of antiepileptic medication use during pregnancy. This medication may cause a decrease in folic acid. You should make sure that you get enough folic acid while you are taking this medication. Discuss the foods you eat and the vitamins you take with your care team. What side effects may I notice from receiving this medication? Side effects that you should report to your care team as soon as possible: Allergic reactions--skin rash, itching, hives, swelling of the face, lips, tongue, or  throat Change in vision Fever, neck pain or stiffness, sensitivity to light, headache, nausea, vomiting, confusion Heart rhythm changes--fast or irregular heartbeat, dizziness, feeling faint or lightheaded, chest pain, trouble breathing Infection--fever, chills, cough, or sore throat Liver injury--right upper belly pain, loss of appetite, nausea, light-colored stool, dark yellow or brown urine, yellowing skin or eyes, unusual weakness or fatigue Low red blood cell count--unusual weakness or fatigue, dizziness, headache, trouble breathing Rash, fever, and swollen lymph nodes Redness, blistering, peeling or loosening of the skin, including inside the mouth Thoughts of suicide or self-harm, worsening mood, or feelings of depression Unusual bruising or bleeding Side effects that usually do not require medical attention (report to your care team if they continue or are bothersome): Diarrhea Dizziness Drowsiness Headache Nausea Stomach pain Tremors or shaking This list may not describe all possible side effects. Call your doctor for medical advice about side effects. You may report side effects to FDA at 1-800-FDA-1088. Where should I keep my medication? Keep out of the reach of children and pets. Store at [employer] [facility] (77 degrees F). Protect from light. Get rid of any unused medication after the expiration date. To get rid of medications that are no longer needed or have expired: Take the medication to a  medication take-back program. Check with your pharmacy or law enforcement to find a location. If you cannot return the medication, check the label or package insert to see if the medication should be thrown out in the garbage or flushed down the toilet. If you are not sure, ask your care team. If it is safe to put it in the trash, empty the medication out of the container. Mix the medication with cat litter, dirt, coffee grounds, or other unwanted substance. Seal the mixture in a bag or  container. Put it in the trash. NOTE: This sheet is a summary. It may not cover all possible information. If you have questions about this medicine, talk to your doctor, pharmacist, or health care provider.  2024 Elsevier/Gold Standard (2022-01-30 00:00:00)

## 2023-05-06 NOTE — Procedures (Signed)
Full Name: Nancy Duncan Gender: Female MRN #: [id number] Date of Birth: [birth year]    Visit Date: 05/06/2023 14:09 Age: 82 Years Examining Physician: Dr. Naomie Dean Referring Physician: Dr. Despina Arias Height: 5 feet 5 inch  History: Leg muscle tightness. Patient says she has some numbness in her legs and in the past she was on B12 and that was helpful. Alpha Lipoic acid helped. All of a sudden her legs became very tight.She denies tingling, burning or any pain at this time more tightness in the legs and "some numbness". Tightness on the feet and lower legs. No cramping. No low back pain, no radicular symptoms. No swelling. Gabapentin has helped reduce the symptoms, no decreased range of motion. Both legs symmetrical. Stop gabapentin due to side effects and start Lamictal and titrate to 50g lamictal bid  Summary: Nerve conductions were performed on the lower extremities and right radial sensory of the upper arm: The right peroneal motor nerve showed reduced amplitude (0.9 mV, normal greater than 2) and decreased conduction velocity (fib head to ankle, 34 m/s, normal greater than 44).  The left peroneal showed reduced amplitude (1.4 mV, normal greater than 2) and decreased conduction velocity (fib head to ankle, 37 m/s, normal greater than 44).  The right radial sensory nerve showed decreased amplitude (12 V, normal greater than 15).  The right sural, left sural, right superficial peroneal and left superficial peroneal sensory nerves all showed no response. All remaining nerves (as indicated in the following tables) were within normal limits.   EMG was performed on the right lower extremity.  All muscles (as indicated in the following tables) were within normal limits.     Conclusion: Nerve conductions showed sensory-predominant, length-dependent, axonal, sensorimotor polyneuropathy with normal emg needle muscle testing. No evidence for lumbar radiculopathy or muscle disease.     ------------------------------- Naomie Dean, M.D.  [facility] [address] [place], [place] [address] Tel: [phone] Fax: [phone]  Verbal informed consent was obtained from the patient, patient was informed of potential risk of procedure, including bruising, bleeding, hematoma formation, infection, muscle weakness, muscle pain, numbness, among others.        MNC    Nerve / Sites Muscle Latency Ref. Amplitude Ref. Rel Amp Segments Distance Velocity Ref. Area    ms ms mV mV %  cm m/s m/s mVms  R Peroneal - EDB     Ankle EDB 5.9 <=6.5 0.9 >=2.0 100 Ankle - EDB 9   2.2     Fib head EDB 13.1  1.2  131 Fib head - Ankle 24.6 34 >=44 2.7     Pop fossa EDB 15.0  0.5  44.4 Pop fossa - Fib head 12.6 68 >=44 1.1         Pop fossa - Ankle      L Peroneal - EDB     Ankle EDB 5.1 <=6.5 1.4 >=2.0 100 Ankle - EDB 9   3.9     Fib head EDB 11.8  1.4  97.4 Fib head - Ankle 25 37 >=44 4.3     Pop fossa EDB 14.1  1.1  80.4 Pop fossa - Fib head 11 48 >=44 3.0         Pop fossa - Ankle      R Tibial - AH     Ankle AH 4.0 <=5.8 4.1 >=4.0 100 Ankle - AH 9   12.7     Pop fossa AH  12.2  2.7  67.3 Pop fossa - Ankle 34 41 >=41 9.5  L Tibial - AH     Ankle AH 4.3 <=5.8 4.3 >=4.0 100 Ankle - AH 9   12.8     Pop fossa AH 12.1  2.6  60.4 Pop fossa - Ankle 34 43 >=41 7.1             SNC    Nerve / Sites Rec. Site Peak Lat Ref.  Amp Ref. Segments Distance    ms ms V V  cm  R Radial - Anatomical snuff box (Forearm)     Forearm Wrist 2.3 <=2.9 12 >=15 Forearm - Wrist 10  R Sural - Ankle (Calf)     Calf Ankle NR <=4.4 NR >=6 Calf - Ankle 14  L Sural - Ankle (Calf)     Calf Ankle NR <=4.4 NR >=6 Calf - Ankle 14  R Superficial peroneal - Ankle     Lat leg Ankle NR <=4.4 NR >=6 Lat leg - Ankle 14  L Superficial peroneal - Ankle     Lat leg Ankle NR <=4.4 NR >=6 Lat leg - Ankle 14               F  Wave    Nerve F Lat Ref.   ms ms  R Tibial - AH 56.0 <=56.0  L  Tibial - AH 34.6 <=56.0         EMG Summary Table    Spontaneous MUAP Recruitment  Muscle IA Fib PSW Fasc Other Amp Dur. Poly Pattern  R. Vastus medialis Normal None None None _______ Normal Normal Normal Normal  R. Biceps femoris (long head) Normal None None None _______ Normal Normal Normal Normal  R. Gluteus maximus Normal None None None _______ Normal Normal Normal Normal  R. Gluteus medius Normal None None None _______ Normal Normal Normal Normal  R. Lumbar paraspinals (low) Normal None None None _______ Normal Normal Normal Normal  R. Tibialis anterior Normal None None None _______ Normal Normal Normal Normal  R. Gastrocnemius (Medial head) Normal None None None _______ Normal Normal Normal Normal  R. Extensor hallucis longus Normal None None None _______ Normal Normal Normal Normal  R. Abductor hallucis Normal None None None _______ Normal Normal Normal Normal  R. Thoracic paraspinals (mid) Normal None None None _______ Normal Normal Normal Normal

## 2023-05-06 NOTE — Progress Notes (Addendum)
Full Name: Nancy Duncan Gender: Female MRN #: [id number] Date of Birth: [birth year]    Visit Date: 05/06/2023 14:09 Age: 82 Years Examining Physician: Dr. Naomie Dean Referring Physician: Dr. Despina Arias Height: 5 feet 5 inch  History: Leg muscle tightness. Patient says she has some numbness in her legs and in the past she was on B12 and that was helpful. Alpha Lipoic acid helped. All of a sudden her legs became very tight.She denies tingling, burning or any pain at this time more tightness in the legs and "some numbness". Tightness on the feet and lower legs. No cramping. No low back pain, no radicular symptoms. No swelling. Gabapentin has helped reduce the symptoms, no decreased range of motion. Both legs symmetrical. Stop gabapentin due to side effects and start Lamictal and titrate to 50g lamictal bid  Summary: Nerve conductions were performed on the lower extremities and right radial sensory of the upper arm: The right peroneal motor nerve showed reduced amplitude (0.9 mV, normal greater than 2) and decreased conduction velocity (fib head to ankle, 34 m/s, normal greater than 44).  The left peroneal showed reduced amplitude (1.4 mV, normal greater than 2) and decreased conduction velocity (fib head to ankle, 37 m/s, normal greater than 44).  The right radial sensory nerve showed decreased amplitude (12 V, normal greater than 15).  The right sural, left sural, right superficial peroneal and left superficial peroneal sensory nerves all showed no response. All remaining nerves (as indicated in the following tables) were within normal limits.   EMG was performed on the right lower extremity.  All muscles (as indicated in the following tables) were within normal limits.     Conclusion: Nerve conductions showed sensory-predominant, length-dependent, axonal, sensorimotor polyneuropathy with normal emg needle muscle testing. No evidence for lumbar radiculopathy or muscle disease.     ------------------------------- Naomie Dean, M.D.  [facility] [address] [place], [place] [address] Tel: [phone] Fax: [phone]  Verbal informed consent was obtained from the patient, patient was informed of potential risk of procedure, including bruising, bleeding, hematoma formation, infection, muscle weakness, muscle pain, numbness, among others.        MNC    Nerve / Sites Muscle Latency Ref. Amplitude Ref. Rel Amp Segments Distance Velocity Ref. Area    ms ms mV mV %  cm m/s m/s mVms  R Peroneal - EDB     Ankle EDB 5.9 <=6.5 0.9 >=2.0 100 Ankle - EDB 9   2.2     Fib head EDB 13.1  1.2  131 Fib head - Ankle 24.6 34 >=44 2.7     Pop fossa EDB 15.0  0.5  44.4 Pop fossa - Fib head 12.6 68 >=44 1.1         Pop fossa - Ankle      L Peroneal - EDB     Ankle EDB 5.1 <=6.5 1.4 >=2.0 100 Ankle - EDB 9   3.9     Fib head EDB 11.8  1.4  97.4 Fib head - Ankle 25 37 >=44 4.3     Pop fossa EDB 14.1  1.1  80.4 Pop fossa - Fib head 11 48 >=44 3.0         Pop fossa - Ankle      R Tibial - AH     Ankle AH 4.0 <=5.8 4.1 >=4.0 100 Ankle - AH 9   12.7     Pop fossa AH  12.2  2.7  67.3 Pop fossa - Ankle 34 41 >=41 9.5  L Tibial - AH     Ankle AH 4.3 <=5.8 4.3 >=4.0 100 Ankle - AH 9   12.8     Pop fossa AH 12.1  2.6  60.4 Pop fossa - Ankle 34 43 >=41 7.1             SNC    Nerve / Sites Rec. Site Peak Lat Ref.  Amp Ref. Segments Distance    ms ms V V  cm  R Radial - Anatomical snuff box (Forearm)     Forearm Wrist 2.3 <=2.9 12 >=15 Forearm - Wrist 10  R Sural - Ankle (Calf)     Calf Ankle NR <=4.4 NR >=6 Calf - Ankle 14  L Sural - Ankle (Calf)     Calf Ankle NR <=4.4 NR >=6 Calf - Ankle 14  R Superficial peroneal - Ankle     Lat leg Ankle NR <=4.4 NR >=6 Lat leg - Ankle 14  L Superficial peroneal - Ankle     Lat leg Ankle NR <=4.4 NR >=6 Lat leg - Ankle 14               F  Wave    Nerve F Lat Ref.   ms ms  R Tibial - AH 56.0 <=56.0  L  Tibial - AH 34.6 <=56.0         EMG Summary Table    Spontaneous MUAP Recruitment  Muscle IA Fib PSW Fasc Other Amp Dur. Poly Pattern  R. Vastus medialis Normal None None None _______ Normal Normal Normal Normal  R. Biceps femoris (long head) Normal None None None _______ Normal Normal Normal Normal  R. Gluteus maximus Normal None None None _______ Normal Normal Normal Normal  R. Gluteus medius Normal None None None _______ Normal Normal Normal Normal  R. Lumbar paraspinals (low) Normal None None None _______ Normal Normal Normal Normal  R. Tibialis anterior Normal None None None _______ Normal Normal Normal Normal  R. Gastrocnemius (Medial head) Normal None None None _______ Normal Normal Normal Normal  R. Extensor hallucis longus Normal None None None _______ Normal Normal Normal Normal  R. Abductor hallucis Normal None None None _______ Normal Normal Normal Normal  R. Thoracic paraspinals (mid) Normal None None None _______ Normal Normal Normal Normal

## 2023-05-10 ENCOUNTER — Encounter: Payer: Self-pay | Admitting: Neurology

## 2023-05-16 ENCOUNTER — Telehealth: Payer: Self-pay | Admitting: Neurology

## 2023-05-16 ENCOUNTER — Ambulatory Visit: Payer: Medicare Other | Admitting: Emergency Medicine

## 2023-05-16 ENCOUNTER — Encounter: Payer: Self-pay | Admitting: Emergency Medicine

## 2023-05-16 VITALS — BP 120/68 | HR 73 | Temp 98.3°F | Ht 65.0 in | Wt 184.0 lb

## 2023-05-16 DIAGNOSIS — G629 Polyneuropathy, unspecified: Secondary | ICD-10-CM

## 2023-05-16 DIAGNOSIS — E785 Hyperlipidemia, unspecified: Secondary | ICD-10-CM

## 2023-05-16 MED ORDER — ROSUVASTATIN CALCIUM 10 MG PO TABS
10.0000 mg | ORAL_TABLET | Freq: Every day | ORAL | 3 refills | Status: DC
Start: 2023-05-16 — End: 2023-12-23

## 2023-05-16 MED ORDER — PREGABALIN 50 MG PO CAPS: 50.0000 mg | ORAL_CAPSULE | Freq: Two times a day (BID) | ORAL | 5 refills | Status: DC

## 2023-05-16 NOTE — Telephone Encounter (Addendum)
Spoke w/ Dr. Epimenio Foot. He would like pt to stop lamotrigine and start on pregabalin 50mg , take 1 po BID #60, 5 refills.  I called pt back. Relayed Dr. Bonnita Hollow message. She was in agreement to plan. She will stop lamotrigine and start on pregabalin. She will call if unable to tolerate.

## 2023-05-16 NOTE — Assessment & Plan Note (Signed)
Recently seen and evaluated by neurologist Did not tolerate gabapentin Recently prescribed Lyrica but has not started it yet.

## 2023-05-16 NOTE — Telephone Encounter (Signed)
Pt last seen 03/28/23 and next f/u 10/16/23.   EMG/NCS completed by Dr. Lucia Gaskins 05/06/23: "Conclusion: Nerve conductions showed sensory-predominant, length-dependent, axonal, sensorimotor polyneuropathy with normal emg needle muscle testing. No evidence for lumbar radiculopathy or muscle disease. "  She stopped her gabapentin that day d/t SE and started her on lamotrigine; Sig: Start with one tab(25mg ) in the morning for one week. Then 25mg  (1 tab) twice daily for one week. Then 2 tabs in the morning and one at night for one week. Then 2 tabs(50mg ) in the morning and 2 tabs(50mg ) at bedtime.  Has also tried/failed: alpha lipoic acid 600 mg twice daily.   Will speak with MD about recommendation.

## 2023-05-16 NOTE — Progress Notes (Signed)
Nancy Duncan 82 y.o.   Chief Complaint  Patient presents with   Acute Visit    High cholesterol concerns     HISTORY OF PRESENT ILLNESS: This is a 82 y.o. female concerned about high cholesterol levels. Side effects from atorvastatin in the past. Neurology office visit on 03/28/2023 assessment and plan as follows: ASSESSMENT AND PLAN   Polyneuropathy   Dysesthesia   Vertigo   Left arm weakness   Hyperlipidemia, unspecified hyperlipidemia type   Gabapentin for dysesthesias If neuropathic symptoms worsen, will check NCV/EMG She has ICA occlusion and elevated lipids.   Could not tolerate a statin so I sent in a script for Welchol.  Continue ASA Rtc 6 months   Richard A. Epimenio Foot, MD, [facility] 03/28/2023, 10:05 AM Certified in Neurology, Clinical Neurophysiology, Sleep Medicine and Neuroimaging   [facility] [address], [address] [place], [place] [address] [phone]  Lab Results  Component Value Date   CHOL 278 (H) 01/23/2023   HDL 62.70 01/23/2023   LDLCALC 179 (H) 01/23/2023   TRIG 180.0 (H) 01/23/2023   CHOLHDL 4 01/23/2023     HPI   Prior to Admission medications   Medication Sig Start Date End Date Taking? Authorizing Provider  alendronate (FOSAMAX) 70 MG tablet TAKE ONE TABLET BY MOUTH ONCE A WEEK 09/15/22  Yes [name], MD  ASPIRIN 81 PO Take by mouth.   Yes [provider]  citalopram (CELEXA) 10 MG tablet TAKE 1 TABLET BY MOUTH DAILY 01/05/23  Yes Toula Miyasaki, [name], MD  meclizine (ANTIVERT) 12.5 MG tablet Take 1 tablet (12.5 mg total) by mouth 3 (three) times daily as needed for dizziness. 08/14/22  Yes Kellan Raffield, [name], MD  pregabalin (LYRICA) 50 MG capsule Take 1 capsule (50 mg total) by mouth 2 (two) times daily. 05/16/23  Yes Sater, [name], MD  colesevelam [facility]) 625 MG tablet Take 2 tablets (1,250 mg total) by mouth 2 (two) times daily with a meal. 03/28/23   Sater, [name], MD  lamoTRIgine  (LAMICTAL) 25 MG tablet Start with one tab(25mg ) in the morning for one week. Then 25mg  (1 tab) twice daily for one week. Then 2 tabs in the morning and one at night for one week. Then 2 tabs(50mg ) in the morning and 2 tabs(50mg ) at bedtime. 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   Ventral hernia without obstruction or gangrene 08/02/2021   TIA (transient ischemic attack) 06/15/2021    Past Medical History:  Diagnosis Date   Breast cancer (HCC)    Cancer (HCC)    Breast Cancer   Pancreatic tumor    Personal history of radiation therapy     Past Surgical History:  Procedure Laterality Date   APPENDECTOMY     BREAST LUMPECTOMY     SPLENECTOMY, TOTAL      Social History   Socioeconomic History   Marital status: Widowed    Spouse name: Not on file   Number of children: 3   Years of education: Not on file   Highest education  level: Master's degree (e.g., MA, MS, MEng, MEd, MSW, MBA)  Occupational History   Not on file  Tobacco Use   Smoking status: Former    Types: Cigarettes   Smokeless tobacco: Never  Vaping Use   Vaping status: Never Used  Substance and Sexual Activity   Alcohol use: Yes    Comment: rare   Drug use: Never   Sexual activity: Not on file  Other Topics Concern   Not on file  Social History Narrative   Right handed    Caffeine  cup per day   Lives at home alone    Social Determinants of Health   Financial Resource Strain: Low Risk  (01/19/2023)   Overall Financial Resource Strain (CARDIA)     Difficulty of Paying Living Expenses: Not hard at all  Food Insecurity: No Food Insecurity (01/19/2023)   Hunger Vital Sign    Worried About Running Out of Food in the Last Year: Never true    Ran Out of Food in the Last Year: Never true  Transportation Needs: No Transportation Needs (01/19/2023)   PRAPARE - Administrator, Civil Service (Medical): No    Lack of Transportation (Non-Medical): No  Physical Activity: Unknown (01/19/2023)   Exercise Vital Sign    Days of Exercise per Week: Patient declined    Minutes of Exercise per Session: 30 min  Recent Concern: Physical Activity - Insufficiently Active (01/10/2023)   Exercise Vital Sign    Days of Exercise per Week: 4 days    Minutes of Exercise per Session: 30 min  Stress: No Stress Concern Present (01/19/2023)   [employer] of Occupational Health - Occupational Stress Questionnaire    Feeling of Stress : Only a little  Social Connections: Unknown (01/19/2023)   Social Connection and Isolation Panel [NHANES]    Frequency of Communication with Friends and Family: Twice a week    Frequency of Social Gatherings with Friends and Family: More than three times a week    Attends Religious Services: Patient declined    Database administrator or Organizations: Yes    Attends Banker Meetings: More than 4 times per year    Marital Status: Widowed  Intimate Partner Violence: Not At Risk (01/10/2023)   Humiliation, Afraid, Rape, and Kick questionnaire    Fear of Current or Ex-Partner: No    Emotionally Abused: No    Physically Abused: No    Sexually Abused: No    Family History  Problem Relation Age of Onset   Cancer - Ovarian Mother    Arthritis Father    Alzheimer's disease Sister    Arthritis Sister      Review of Systems  Constitutional: Negative.  Negative for chills and fever.  HENT: Negative.  Negative for congestion and sore throat.   Respiratory: Negative.  Negative for cough and shortness of breath.    Gastrointestinal:  Negative for abdominal pain, diarrhea, nausea and vomiting.  Genitourinary: Negative.  Negative for dysuria and hematuria.  Skin: Negative.  Negative for rash.  Neurological: Negative.  Negative for dizziness and headaches.  All other systems reviewed and are negative.   Vitals:   05/16/23 1520  BP: 120/68  Pulse: 73  Temp: 98.3 F (36.8 C)  SpO2: 96%    Physical Exam Vitals reviewed.  Constitutional:      Appearance: Normal appearance.  Eyes:     Extraocular Movements: Extraocular movements intact.  Cardiovascular:     Rate and Rhythm:  Normal rate.  Pulmonary:     Effort: Pulmonary effort is normal.  Skin:    General: Skin is warm and dry.  Neurological:     Mental Status: She is alert and oriented to person, place, and time.  Psychiatric:        Mood and Affect: Mood normal.        Behavior: Behavior normal.      ASSESSMENT & PLAN: A total of 32 minutes was spent with the patient and counseling/coordination of care regarding preparing for this visit, review of most recent office visit notes, review of most recent blood work results, diagnosis of dyslipidemia and cardiovascular risk associated with it, review of all medications, education on nutrition, prognosis, documentation and need for follow-up.  Problem List Items Addressed This Visit       Nervous and Auditory   Polyneuropathy    Recently seen and evaluated by neurologist Did not tolerate gabapentin Recently prescribed Lyrica but has not started it yet.        Other   Dyslipidemia - Primary    History of statin side effects in the past.  Reacted to atorvastatin. Recently prescribed WelChol but has not started History of peripheral artery disease and TIA in the past Recommend to try rosuvastatin 10 mg daily and monitor side effects closely. Diet and nutrition discussed      Relevant Medications   rosuvastatin (CRESTOR) 10 MG tablet   Patient Instructions   Dyslipidemia Dyslipidemia is an imbalance of waxy, fat-like substances (lipids) in the blood. The body needs lipids in small amounts. Dyslipidemia often involves a high level of cholesterol or triglycerides, which are types of lipids. Common forms of dyslipidemia include: High levels of LDL cholesterol. LDL is the type of cholesterol that causes fatty deposits (plaques) to build up in the blood vessels that carry blood away from the heart (arteries). Low levels of HDL cholesterol. HDL cholesterol is the type of cholesterol that protects against heart disease. High levels of HDL remove the LDL buildup from arteries. High levels of triglycerides. Triglycerides are a fatty substance in the blood that is linked to a buildup of plaques in the arteries. What are the causes? There are two main types of dyslipidemia: primary and secondary. Primary dyslipidemia is caused by changes (mutations) in genes that are passed down through families (inherited). These mutations cause several types of dyslipidemia. Secondary dyslipidemia may be caused by various risk factors that can lead to the disease, such as lifestyle choices and certain medical conditions. What increases the risk? You are more likely to develop this condition if you are an older man or if you are a woman who has gone through menopause. Other risk factors include: Having a family history of dyslipidemia. Taking certain medicines, including birth control pills, steroids, some diuretics, and beta-blockers. Eating a diet high in saturated fat. Smoking cigarettes or excessive alcohol intake. Having certain medical conditions such as diabetes, polycystic ovary syndrome (PCOS), kidney disease, liver disease, or hypothyroidism. Not exercising regularly. Being overweight or obese with too much belly fat. What are the signs or symptoms? In most cases, dyslipidemia does not usually cause any symptoms. In severe cases, very high lipid levels can  cause: Fatty bumps under the skin (xanthomas). A white or gray ring around the black center (pupil) of the eye. Very high triglyceride levels can cause inflammation of the pancreas (pancreatitis). How is this diagnosed? Your health care provider may diagnose dyslipidemia based on a routine blood test (fasting blood  test). Because most people do not have symptoms of the condition, this blood testing (lipid profile) is done on adults age 64 and older and is repeated every 4-6 years. This test checks: Total cholesterol. This measures the total amount of cholesterol in your blood, including LDL cholesterol, HDL cholesterol, and triglycerides. A healthy number is below 200 mg/dL (1.61 mmol/L). LDL cholesterol. The target number for LDL cholesterol is different for each person, depending on individual risk factors. A healthy number is usually below 100 mg/dL (0.96 mmol/L). Ask your health care provider what your LDL cholesterol should be. HDL cholesterol. An HDL level of 60 mg/dL (0.45 mmol/L) or higher is best because it helps to protect against heart disease. A number below 40 mg/dL (4.09 mmol/L) for men or below 50 mg/dL (8.11 mmol/L) for women increases the risk for heart disease. Triglycerides. A healthy triglyceride number is below 150 mg/dL (9.14 mmol/L). If your lipid profile is abnormal, your health care provider may do other blood tests. How is this treated? Treatment depends on the type of dyslipidemia that you have and your other risk factors for heart disease and stroke. Your health care provider will have a target range for your lipid levels based on this information. Treatment for dyslipidemia starts with lifestyle changes, such as diet and exercise. Your health care provider may recommend that you: Get regular exercise. Make changes to your diet. Quit smoking if you smoke. Limit your alcohol intake. If diet changes and exercise do not help you reach your goals, your health care provider  may also prescribe medicine to lower lipids. The most commonly prescribed type of medicine lowers your LDL cholesterol (statin drug). If you have a high triglyceride level, your provider may prescribe another type of drug (fibrate) or an omega-3 fish oil supplement, or both. Follow these instructions at home: Eating and drinking  Follow instructions from your health care provider or dietitian about eating or drinking restrictions. Eat a healthy diet as told by your health care provider. This can help you reach and maintain a healthy weight, lower your LDL cholesterol, and raise your HDL cholesterol. This may include: Limiting your calories, if you are overweight. Eating more fruits, vegetables, whole grains, fish, and lean meats. Limiting saturated fat, trans fat, and cholesterol. Do not drink alcohol if: Your health care provider tells you not to drink. You are pregnant, may be pregnant, or are planning to become pregnant. If you drink alcohol: Limit how much you have to: 0-1 drink a day for women. 0-2 drinks a day for men. Know how much alcohol is in your drink. In the [place] one drink equals one 12 oz bottle of beer (355 mL), one 5 oz glass of wine (148 mL), or one 1 oz glass of hard liquor (44 mL). Activity Get regular exercise. Start an exercise and strength training program as told by your health care provider. Ask your health care provider what activities are safe for you. Your health care provider may recommend: 30 minutes of aerobic activity 4-6 days a week. Brisk walking is an example of aerobic activity. Strength training 2 days a week. General instructions Do not use any products that contain nicotine or tobacco. These products include cigarettes, chewing tobacco, and vaping devices, such as e-cigarettes. If you need help quitting, ask your health care provider. Take over-the-counter and prescription medicines only as told by your health care provider. This includes  supplements. Keep all follow-up visits. This is important. Contact a health care provider if:  You are having trouble sticking to your exercise or diet plan. You are struggling to quit smoking or to control your use of alcohol. Summary Dyslipidemia often involves a high level of cholesterol or triglycerides, which are types of lipids. Treatment depends on the type of dyslipidemia that you have and your other risk factors for heart disease and stroke. Treatment for dyslipidemia starts with lifestyle changes, such as diet and exercise. Your health care provider may prescribe medicine to lower lipids. This information is not intended to replace advice given to you by your health care provider. Make sure you discuss any questions you have with your health care provider. Document Revised: 02/23/2022 Document Reviewed: 09/26/2020 Elsevier Patient Education  2024 Elsevier Inc.    Nancy Barth, MD [facility]

## 2023-05-16 NOTE — Telephone Encounter (Signed)
Pt reports that she wants to stop taking  lamoTRIgine (LAMICTAL) 25 MG tablet Pt states it causes her muscles to hurt and it causes her to not be able to sleep thru the night.

## 2023-05-16 NOTE — Assessment & Plan Note (Signed)
History of statin side effects in the past.  Reacted to atorvastatin. Recently prescribed WelChol but has not started History of peripheral artery disease and TIA in the past Recommend to try rosuvastatin 10 mg daily and monitor side effects closely. Diet and nutrition discussed

## 2023-05-16 NOTE — Patient Instructions (Signed)
Dyslipidemia Dyslipidemia is an imbalance of waxy, fat-like substances (lipids) in the blood. The body needs lipids in small amounts. Dyslipidemia often involves a high level of cholesterol or triglycerides, which are types of lipids. Common forms of dyslipidemia include: High levels of LDL cholesterol. LDL is the type of cholesterol that causes fatty deposits (plaques) to build up in the blood vessels that carry blood away from the heart (arteries). Low levels of HDL cholesterol. HDL cholesterol is the type of cholesterol that protects against heart disease. High levels of HDL remove the LDL buildup from arteries. High levels of triglycerides. Triglycerides are a fatty substance in the blood that is linked to a buildup of plaques in the arteries. What are the causes? There are two main types of dyslipidemia: primary and secondary. Primary dyslipidemia is caused by changes (mutations) in genes that are passed down through families (inherited). These mutations cause several types of dyslipidemia. Secondary dyslipidemia may be caused by various risk factors that can lead to the disease, such as lifestyle choices and certain medical conditions. What increases the risk? You are more likely to develop this condition if you are an older man or if you are a woman who has gone through menopause. Other risk factors include: Having a family history of dyslipidemia. Taking certain medicines, including birth control pills, steroids, some diuretics, and beta-blockers. Eating a diet high in saturated fat. Smoking cigarettes or excessive alcohol intake. Having certain medical conditions such as diabetes, polycystic ovary syndrome (PCOS), kidney disease, liver disease, or hypothyroidism. Not exercising regularly. Being overweight or obese with too much belly fat. What are the signs or symptoms? In most cases, dyslipidemia does not usually cause any symptoms. In severe cases, very high lipid levels can  cause: Fatty bumps under the skin (xanthomas). A white or gray ring around the black center (pupil) of the eye. Very high triglyceride levels can cause inflammation of the pancreas (pancreatitis). How is this diagnosed? Your health care provider may diagnose dyslipidemia based on a routine blood test (fasting blood test). Because most people do not have symptoms of the condition, this blood testing (lipid profile) is done on adults age 20 and older and is repeated every 4-6 years. This test checks: Total cholesterol. This measures the total amount of cholesterol in your blood, including LDL cholesterol, HDL cholesterol, and triglycerides. A healthy number is below 200 mg/dL (5.17 mmol/L). LDL cholesterol. The target number for LDL cholesterol is different for each person, depending on individual risk factors. A healthy number is usually below 100 mg/dL (2.59 mmol/L). Ask your health care provider what your LDL cholesterol should be. HDL cholesterol. An HDL level of 60 mg/dL (1.55 mmol/L) or higher is best because it helps to protect against heart disease. A number below 40 mg/dL (1.03 mmol/L) for men or below 50 mg/dL (1.29 mmol/L) for women increases the risk for heart disease. Triglycerides. A healthy triglyceride number is below 150 mg/dL (1.69 mmol/L). If your lipid profile is abnormal, your health care provider may do other blood tests. How is this treated? Treatment depends on the type of dyslipidemia that you have and your other risk factors for heart disease and stroke. Your health care provider will have a target range for your lipid levels based on this information. Treatment for dyslipidemia starts with lifestyle changes, such as diet and exercise. Your health care provider may recommend that you: Get regular exercise. Make changes to your diet. Quit smoking if you smoke. Limit your alcohol intake. If diet   changes and exercise do not help you reach your goals, your health care provider  may also prescribe medicine to lower lipids. The most commonly prescribed type of medicine lowers your LDL cholesterol (statin drug). If you have a high triglyceride level, your provider may prescribe another type of drug (fibrate) or an omega-3 fish oil supplement, or both. Follow these instructions at home: Eating and drinking  Follow instructions from your health care provider or dietitian about eating or drinking restrictions. Eat a healthy diet as told by your health care provider. This can help you reach and maintain a healthy weight, lower your LDL cholesterol, and raise your HDL cholesterol. This may include: Limiting your calories, if you are overweight. Eating more fruits, vegetables, whole grains, fish, and lean meats. Limiting saturated fat, trans fat, and cholesterol. Do not drink alcohol if: Your health care provider tells you not to drink. You are pregnant, may be pregnant, or are planning to become pregnant. If you drink alcohol: Limit how much you have to: 0-1 drink a day for women. 0-2 drinks a day for men. Know how much alcohol is in your drink. In the [place] one drink equals one 12 oz bottle of beer (355 mL), one 5 oz glass of wine (148 mL), or one 1 oz glass of hard liquor (44 mL). Activity Get regular exercise. Start an exercise and strength training program as told by your health care provider. Ask your health care provider what activities are safe for you. Your health care provider may recommend: 30 minutes of aerobic activity 4-6 days a week. Brisk walking is an example of aerobic activity. Strength training 2 days a week. General instructions Do not use any products that contain nicotine or tobacco. These products include cigarettes, chewing tobacco, and vaping devices, such as e-cigarettes. If you need help quitting, ask your health care provider. Take over-the-counter and prescription medicines only as told by your health care provider. This includes  supplements. Keep all follow-up visits. This is important. Contact a health care provider if: You are having trouble sticking to your exercise or diet plan. You are struggling to quit smoking or to control your use of alcohol. Summary Dyslipidemia often involves a high level of cholesterol or triglycerides, which are types of lipids. Treatment depends on the type of dyslipidemia that you have and your other risk factors for heart disease and stroke. Treatment for dyslipidemia starts with lifestyle changes, such as diet and exercise. Your health care provider may prescribe medicine to lower lipids. This information is not intended to replace advice given to you by your health care provider. Make sure you discuss any questions you have with your health care provider. Document Revised: 02/23/2022 Document Reviewed: 09/26/2020 Elsevier Patient Education  2024 Elsevier Inc.  

## 2023-06-03 ENCOUNTER — Encounter: Payer: Self-pay | Admitting: Emergency Medicine

## 2023-06-03 ENCOUNTER — Encounter: Payer: Self-pay | Admitting: Neurology

## 2023-06-03 NOTE — Telephone Encounter (Signed)
Is she trying to reach Dr. Epimenio Foot?

## 2023-06-10 DIAGNOSIS — Z87891 Personal history of nicotine dependence: Secondary | ICD-10-CM | POA: Diagnosis not present

## 2023-06-10 DIAGNOSIS — K7689 Other specified diseases of liver: Secondary | ICD-10-CM | POA: Diagnosis not present

## 2023-06-10 DIAGNOSIS — K439 Ventral hernia without obstruction or gangrene: Secondary | ICD-10-CM | POA: Diagnosis not present

## 2023-06-10 DIAGNOSIS — C7A8 Other malignant neuroendocrine tumors: Secondary | ICD-10-CM | POA: Diagnosis not present

## 2023-06-10 DIAGNOSIS — K862 Cyst of pancreas: Secondary | ICD-10-CM | POA: Diagnosis not present

## 2023-06-26 ENCOUNTER — Encounter: Payer: Self-pay | Admitting: *Deleted

## 2023-06-26 ENCOUNTER — Other Ambulatory Visit: Payer: Self-pay | Admitting: *Deleted

## 2023-06-26 MED ORDER — DULOXETINE HCL 30 MG PO CPEP: 30.0000 mg | ORAL_CAPSULE | Freq: Every day | ORAL | 3 refills | Status: DC

## 2023-06-28 ENCOUNTER — Encounter: Payer: Self-pay | Admitting: Emergency Medicine

## 2023-07-02 ENCOUNTER — Ambulatory Visit: Payer: Medicare HMO | Admitting: Emergency Medicine

## 2023-07-03 ENCOUNTER — Encounter: Payer: Self-pay | Admitting: Emergency Medicine

## 2023-07-03 ENCOUNTER — Ambulatory Visit: Payer: Medicare HMO | Admitting: Emergency Medicine

## 2023-07-03 VITALS — BP 120/72 | HR 82 | Temp 98.3°F | Ht 65.0 in | Wt 180.4 lb

## 2023-07-03 DIAGNOSIS — E785 Hyperlipidemia, unspecified: Secondary | ICD-10-CM

## 2023-07-03 DIAGNOSIS — Z8739 Personal history of other diseases of the musculoskeletal system and connective tissue: Secondary | ICD-10-CM | POA: Diagnosis not present

## 2023-07-03 DIAGNOSIS — Z8673 Personal history of transient ischemic attack (TIA), and cerebral infarction without residual deficits: Secondary | ICD-10-CM

## 2023-07-03 DIAGNOSIS — B9789 Other viral agents as the cause of diseases classified elsewhere: Secondary | ICD-10-CM | POA: Insufficient documentation

## 2023-07-03 DIAGNOSIS — J988 Other specified respiratory disorders: Secondary | ICD-10-CM | POA: Diagnosis not present

## 2023-07-03 NOTE — Assessment & Plan Note (Signed)
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  Was advised against surgery.

## 2023-07-03 NOTE — Assessment & Plan Note (Signed)
Clinically stable.  No red flag signs or symptoms. Running its course without complications Symptom management discussed Recommend over-the-counter Mucinex DM Advised to rest and stay well-hydrated

## 2023-07-03 NOTE — Patient Instructions (Signed)

## 2023-07-03 NOTE — Assessment & Plan Note (Signed)
History of statin side effects in the past.  Reacted to atorvastatin. Recently prescribed WelChol but has not started History of peripheral artery disease and TIA in the past Recommend to try rosuvastatin 10 mg daily and monitor side effects closely. Diet and nutrition discussed

## 2023-07-03 NOTE — Assessment & Plan Note (Signed)
Stable.  On weekly Fosamax 70 mg.

## 2023-07-03 NOTE — Progress Notes (Signed)
Nancy Duncan 82 y.o.   Chief Complaint  Patient presents with   Cough    Patient states it started a week and half ago, after getting back from [facility]. Chest congestion, cough. No fever, no headache, no body chills. Achy, fatigue, cough. Patient also had some questions about medications she wanted to talk provider about    HISTORY OF PRESENT ILLNESS: This is a 82 y.o. female complaining of flulike symptoms that started about 10 days ago.  Slowly getting better Still has some residual congestion and cough Has some questions about her medications No other complaints or medical concerns today.  Cough Pertinent negatives include no chest pain, chills, fever, headaches or rash.     Prior to Admission medications   Medication Sig Start Date End Date Taking? Authorizing Provider  alendronate (FOSAMAX) 70 MG tablet TAKE ONE TABLET BY MOUTH ONCE A WEEK 09/15/22  Yes [name], MD  ASPIRIN 81 PO Take by mouth.   Yes [provider]  citalopram (CELEXA) 10 MG tablet TAKE 1 TABLET BY MOUTH DAILY 01/05/23  Yes Cambrey Lupi, [name], MD  hydrOXYzine (ATARAX) 25 MG tablet Take 25 mg by mouth 3 (three) times daily as needed (for sleep).   Yes [provider]  meclizine (ANTIVERT) 12.5 MG tablet Take 1 tablet (12.5 mg total) by mouth 3 (three) times daily as needed for dizziness. 08/14/22  Yes [name], MD  colesevelam [facility]) 625 MG tablet Take 2 tablets (1,250 mg total) by mouth 2 (two) times daily with a meal. 03/28/23   Sater, [name], MD  DULoxetine (CYMBALTA) 30 MG capsule Take 1 capsule (30 mg total) by mouth daily. Patient not taking: Reported on 07/03/2023 06/26/23   Asa Lente, MD  rosuvastatin (CRESTOR) 10 MG tablet Take 1 tablet (10 mg total) by mouth daily. 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   Ventral hernia without obstruction or gangrene 08/02/2021   TIA (transient ischemic attack) 06/15/2021    Past Medical History:  Diagnosis Date   Breast cancer (HCC)    Cancer (HCC)    Breast Cancer   Pancreatic tumor    Personal history of radiation therapy     Past Surgical History:  Procedure Laterality Date   APPENDECTOMY     BREAST LUMPECTOMY     SPLENECTOMY, TOTAL      Social History   Socioeconomic History   Marital status: Widowed    Spouse name: Not on file   Number of children: 3   Years of education: Not on file   Highest education level: Master's degree (e.g., MA, MS, MEng, MEd, MSW, MBA)  Occupational History   Not on file  Tobacco Use   Smoking status: Former    Types: Cigarettes   Smokeless tobacco: Never  Vaping Use   Vaping status: Never Used  Substance and Sexual Activity  Alcohol use: Yes    Comment: rare   Drug use: Never   Sexual activity: Not on file  Other Topics Concern   Not on file  Social History Narrative   Right handed    Caffeine  cup per day   Lives at home alone    Social Determinants of Health   Financial Resource Strain: Low Risk  (06/30/2023)   Overall Financial Resource Strain (CARDIA)    Difficulty of Paying Living Expenses: Not hard at all  Food Insecurity: No Food Insecurity (06/30/2023)   Hunger Vital Sign    Worried About Running Out of Food in the Last Year: Never true    Ran Out of Food in the Last Year: Never true  Transportation Needs: No  Transportation Needs (06/30/2023)   PRAPARE - Administrator, Civil Service (Medical): No    Lack of Transportation (Non-Medical): No  Physical Activity: Sufficiently Active (06/30/2023)   Exercise Vital Sign    Days of Exercise per Week: 5 days    Minutes of Exercise per Session: 60 min  Stress: No Stress Concern Present (06/30/2023)   [employer] of Occupational Health - Occupational Stress Questionnaire    Feeling of Stress : Not at all  Social Connections: Moderately Isolated (06/30/2023)   Social Connection and Isolation Panel [NHANES]    Frequency of Communication with Friends and Family: More than three times a week    Frequency of Social Gatherings with Friends and Family: More than three times a week    Attends Religious Services: Never    Database administrator or Organizations: Yes    Attends Engineer, structural: More than 4 times per year    Marital Status: Widowed  Intimate Partner Violence: Not At Risk (01/10/2023)   Humiliation, Afraid, Rape, and Kick questionnaire    Fear of Current or Ex-Partner: No    Emotionally Abused: No    Physically Abused: No    Sexually Abused: No    Family History  Problem Relation Age of Onset   Cancer - Ovarian Mother    Arthritis Father    Alzheimer's disease Sister    Arthritis Sister      Review of Systems  Constitutional: Negative.  Negative for chills and fever.  HENT:  Positive for congestion.   Respiratory:  Positive for cough.   Cardiovascular: Negative.  Negative for chest pain and palpitations.  Gastrointestinal:  Negative for abdominal pain, diarrhea, nausea and vomiting.  Genitourinary: Negative.  Negative for dysuria and hematuria.  Skin: Negative.  Negative for rash.  Neurological: Negative.  Negative for dizziness and headaches.  All other systems reviewed and are negative.   Vitals:   07/03/23 0927  BP: 120/72  Pulse: 82  Temp: 98.3 F (36.8 C)  SpO2: 95%    Physical  Exam Vitals reviewed.  Constitutional:      Appearance: Normal appearance.  HENT:     Head: Normocephalic.     Mouth/Throat:     Mouth: Mucous membranes are moist.     Pharynx: Oropharynx is clear.  Eyes:     Extraocular Movements: Extraocular movements intact.     Pupils: Pupils are equal, round, and reactive to light.  Cardiovascular:     Rate and Rhythm: Normal rate and regular rhythm.     Pulses: Normal pulses.     Heart sounds: Normal heart sounds.  Pulmonary:     Effort: Pulmonary effort is normal.     Breath sounds:  Normal breath sounds.  Musculoskeletal:     Cervical back: No tenderness.  Lymphadenopathy:     Cervical: No cervical adenopathy.  Skin:    General: Skin is warm and dry.     Capillary Refill: Capillary refill takes less than 2 seconds.  Neurological:     General: No focal deficit present.     Mental Status: She is alert and oriented to person, place, and time.  Psychiatric:        Mood and Affect: Mood normal.        Behavior: Behavior normal.      ASSESSMENT & PLAN: A total of 43 minutes was spent with the patient and counseling/coordination of care regarding preparing for this visit, review of most recent office visit notes, review of multiple chronic medical conditions and their management, review of all medications, review of most recent bloodwork results, review of health maintenance items, education on nutrition, prognosis, documentation, and need for follow up. .  Problem List Items Addressed This Visit       Respiratory   Viral respiratory infection - Primary    Clinically stable.  No red flag signs or symptoms. 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  Was  advised against surgery.      History of osteoporosis    Stable. On weekly Fosamax 70 mg.       Dyslipidemia    History of statin side effects in the past.  Reacted to atorvastatin. Recently prescribed WelChol but has not started History of peripheral artery disease and TIA in the past Recommend to try rosuvastatin 10 mg daily and monitor side effects closely. Diet and nutrition discussed      Patient Instructions  Viral Respiratory Infection A viral respiratory infection is an illness that affects parts of the body that are used for breathing. These include the lungs, nose, and throat. It is caused by a germ called a virus. Some examples of this kind of infection are: A cold. The flu (influenza). A respiratory syncytial virus (RSV) infection. What are the causes? This condition is caused by a virus. It spreads from person to person. You can get the virus if: You breathe in droplets from someone who is sick. You come in contact with people who are sick. You touch mucus or other fluid from a person who is sick. What are the signs or symptoms? Symptoms of this condition include: A stuffy or runny nose. A sore throat. A cough. Shortness of breath. Trouble breathing. Yellow or green fluid in the nose. Other symptoms may include: A fever. Sweating or chills. Tiredness (fatigue). Achy muscles. A headache. How is this treated? This condition may be treated with: Medicines that treat viruses. Medicines that make it easy to breathe. Medicines that are sprayed into the nose. Acetaminophen or NSAIDs, such as ibuprofen, to treat fever. Follow these instructions at home: Managing pain and congestion Take over-the-counter and prescription medicines only as told by your doctor. If you have a sore throat, gargle with salt water. Do this 3-4 times a day or as needed. To make salt water, dissolve -1 tsp (3-6 g) of salt in 1 cup (237 mL) of warm water. Make sure that all the salt  dissolves. Use  nose drops made from salt water. This helps with stuffiness (congestion). It also helps soften the skin around your nose. Take 2 tsp (10 mL) of honey at bedtime to lessen coughing at night. Do not give honey to children who are younger than 31 year old. Drink enough fluid to keep your pee (urine) pale yellow. General instructions  Rest as much as possible. Do not drink alcohol. Do not smoke or use any products that contain nicotine or tobacco. If you need help quitting, ask your doctor. Keep all follow-up visits. How is this prevented?     Get a flu shot every year. Ask your doctor when you should get your flu shot. Do not let other people get your germs. If you are sick: Wash your hands with soap and water often. Wash your hands after you cough or sneeze. Wash hands for at least 20 seconds. If you cannot use soap and water, use hand sanitizer. Cover your mouth when you cough. Cover your nose and mouth when you sneeze. Do not share cups or eating utensils. Clean commonly used objects often. Clean commonly touched surfaces. Stay home from work or school. Avoid contact with people who are sick during cold and flu season. This is in fall and winter. Get help if: Your symptoms last for 10 days or longer. Your symptoms get worse over time. You have very bad pain in your face or forehead. Parts of your jaw or neck get very swollen. You have shortness of breath. Get help right away if: You feel pain or pressure in your chest. You have trouble breathing. You faint or feel like you will faint. You keep vomiting and it gets worse. You feel confused. These symptoms may be an emergency. Get help right away. Call your local emergency services (911 in the [place] Do not wait to see if the symptoms will go away. Do not drive yourself to the hospital. Summary A viral respiratory infection is an illness that affects parts of the body that are used for breathing. Examples of this  illness include a cold, the flu, and a respiratory syncytial virus (RSV) infection. The infection can cause a runny nose, cough, sore throat, and fever. Follow what your doctor tells you about taking medicines, drinking lots of fluid, washing your hands, resting at home, and avoiding people who are sick. This information is not intended to replace advice given to you by your health care provider. Make sure you discuss any questions you have with your health care provider. Document Revised: 10/27/2020 Document Reviewed: 10/27/2020 Elsevier Patient Education  2024 [employer]     Edwina Barth, MD [facility]

## 2023-07-09 ENCOUNTER — Encounter: Payer: Self-pay | Admitting: Neurology

## 2023-07-16 DIAGNOSIS — H401132 Primary open-angle glaucoma, bilateral, moderate stage: Secondary | ICD-10-CM | POA: Diagnosis not present

## 2023-07-16 DIAGNOSIS — H353132 Nonexudative age-related macular degeneration, bilateral, intermediate dry stage: Secondary | ICD-10-CM | POA: Diagnosis not present

## 2023-07-24 ENCOUNTER — Ambulatory Visit: Payer: Medicare HMO | Admitting: Emergency Medicine

## 2023-07-24 ENCOUNTER — Encounter: Payer: Self-pay | Admitting: Emergency Medicine

## 2023-07-24 VITALS — BP 118/74 | HR 73 | Temp 98.3°F | Ht 65.0 in | Wt 182.5 lb

## 2023-07-24 DIAGNOSIS — Z8739 Personal history of other diseases of the musculoskeletal system and connective tissue: Secondary | ICD-10-CM

## 2023-07-24 DIAGNOSIS — I739 Peripheral vascular disease, unspecified: Secondary | ICD-10-CM

## 2023-07-24 DIAGNOSIS — E785 Hyperlipidemia, unspecified: Secondary | ICD-10-CM

## 2023-07-24 DIAGNOSIS — Z8673 Personal history of transient ischemic attack (TIA), and cerebral infarction without residual deficits: Secondary | ICD-10-CM | POA: Diagnosis not present

## 2023-07-24 DIAGNOSIS — G629 Polyneuropathy, unspecified: Secondary | ICD-10-CM

## 2023-07-24 LAB — COMPREHENSIVE METABOLIC PANEL
ALT: 14 U/L (ref 0–35)
AST: 20 U/L (ref 0–37)
Albumin: 3.9 g/dL (ref 3.5–5.2)
Alkaline Phosphatase: 70 U/L (ref 39–117)
BUN: 9 mg/dL (ref 6–23)
CO2: 24 meq/L (ref 19–32)
Calcium: 9.1 mg/dL (ref 8.4–10.5)
Chloride: 106 meq/L (ref 96–112)
Creatinine, Ser: 0.72 mg/dL (ref 0.40–1.20)
GFR: 76.87 mL/min (ref >60.00)
Glucose, Bld: 97 mg/dL (ref 70–99)
Potassium: 4.6 meq/L (ref 3.5–5.1)
Sodium: 140 meq/L (ref 135–145)
Total Bilirubin: 0.6 mg/dL (ref 0.2–1.2)
Total Protein: 7.1 g/dL (ref 6.0–8.3)

## 2023-07-24 LAB — CBC WITH DIFFERENTIAL/PLATELET
Basophils Absolute: 0.1 10*3/uL (ref 0.0–0.1)
Basophils Relative: 0.8 % (ref 0.0–3.0)
Eosinophils Absolute: 0.3 10*3/uL (ref 0.0–0.7)
Eosinophils Relative: 4.7 % (ref 0.0–5.0)
HCT: 40.6 % (ref 36.0–46.0)
Hemoglobin: 13.7 g/dL (ref 12.0–15.0)
Lymphocytes Relative: 47.5 % — ABNORMAL HIGH (ref 12.0–46.0)
Lymphs Abs: 3.3 10*3/uL (ref 0.7–4.0)
MCHC: 33.6 g/dL (ref 30.0–36.0)
MCV: 97.8 fL (ref 78.0–100.0)
Monocytes Absolute: 0.8 10*3/uL (ref 0.1–1.0)
Monocytes Relative: 11.4 % (ref 3.0–12.0)
Neutro Abs: 2.5 10*3/uL (ref 1.4–7.7)
Neutrophils Relative %: 35.6 % — ABNORMAL LOW (ref 43.0–77.0)
Platelets: 289 10*3/uL (ref 150.0–400.0)
RBC: 4.15 Mil/uL (ref 3.87–5.11)
RDW: 13.6 % (ref 11.5–15.5)
WBC: 7 10*3/uL (ref 4.0–10.5)

## 2023-07-24 LAB — POCT GLYCOSYLATED HEMOGLOBIN (HGB A1C): Hemoglobin A1C: 5.6 % (ref 4.0–5.6)

## 2023-07-24 LAB — LIPID PANEL
Cholesterol: 171 mg/dL (ref 0–200)
HDL: 74.5 mg/dL (ref >39.00)
LDL Cholesterol: 77 mg/dL (ref 0–99)
NonHDL: 96.62
Total CHOL/HDL Ratio: 2
Triglycerides: 97 mg/dL (ref 0.0–149.0)
VLDL: 19.4 mg/dL (ref 0.0–40.0)

## 2023-07-24 NOTE — Assessment & Plan Note (Signed)
Stable.  On weekly Fosamax 70 mg.

## 2023-07-24 NOTE — Progress Notes (Signed)
Nancy Duncan 82 y.o.   Chief Complaint  Patient presents with   Medical Management of Chronic Issues    6 month follow up, want her cholesterol recheck     HISTORY OF PRESENT ILLNESS: This is a 82 y.o. female here for 26-month follow-up of chronic medical conditions including dyslipidemia Overall doing well.  Has no complaints or medical concerns today. Wt Readings from Last 3 Encounters:  07/24/23 182 lb 8 oz (82.8 kg)  07/03/23 180 lb 6.4 oz (81.8 kg)  05/16/23 184 lb (83.5 kg)     HPI   Prior to Admission medications   Medication Sig Start Date End Date Taking? Authorizing Provider  alendronate (FOSAMAX) 70 MG tablet TAKE ONE TABLET BY MOUTH ONCE A WEEK 09/15/22  Yes [name], MD  ASPIRIN 81 PO Take by mouth.   Yes [provider]  citalopram (CELEXA) 10 MG tablet TAKE 1 TABLET BY MOUTH DAILY 01/05/23  Yes [name], [name], MD  colesevelam [employer]) 625 MG tablet Take 2 tablets (1,250 mg total) by mouth 2 (two) times daily with a meal. 03/28/23  Yes Sater, [name], MD  DULoxetine (CYMBALTA) 30 MG capsule Take 1 capsule (30 mg total) by mouth daily. 06/26/23  Yes Sater, [name], MD  hydrOXYzine (ATARAX) 25 MG tablet Take 25 mg by mouth 3 (three) times daily as needed (for sleep).   Yes [provider]  meclizine (ANTIVERT) 12.5 MG tablet Take 1 tablet (12.5 mg total) by mouth 3 (three) times daily as needed for dizziness. 08/14/22  Yes [name], [name], MD  rosuvastatin (CRESTOR) 10 MG tablet Take 1 tablet (10 mg total) by mouth daily. 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   Ventral hernia without obstruction or gangrene 08/02/2021   TIA (transient ischemic attack) 06/15/2021    Past Medical History:  Diagnosis Date   Breast cancer (HCC)    Cancer (HCC)    Breast Cancer   Pancreatic tumor    Personal history of radiation therapy     Past Surgical History:  Procedure Laterality Date   APPENDECTOMY     BREAST LUMPECTOMY     SPLENECTOMY, TOTAL      Social History   Socioeconomic History   Marital status: Widowed    Spouse name: Not on file   Number of children: 3   Years of education: Not on file   Highest education level: Master's degree (e.g., MA, MS, MEng, MEd, MSW, MBA)  Occupational History   Not on file  Tobacco Use   Smoking status: Former    Types: Cigarettes   Smokeless tobacco: Never  Vaping Use   Vaping status: Never Used  Substance and Sexual Activity   Alcohol use: Yes    Comment: rare   Drug use: Never   Sexual activity: Not on file  Other Topics Concern   Not on file  Social History Narrative  Right handed    Caffeine  cup per day   Lives at home alone    Social Drivers of Health   Financial Resource Strain: Low Risk  (07/21/2023)   Overall Financial Resource Strain (CARDIA)    Difficulty of Paying Living Expenses: Not hard at all  Food Insecurity: No Food Insecurity (07/21/2023)   Hunger Vital Sign    Worried About Running Out of Food in the Last Year: Never true    Ran Out of Food in the Last Year: Never true  Transportation Needs: No Transportation Needs (07/21/2023)   PRAPARE - Administrator, Civil Service (Medical): No    Lack of Transportation (Non-Medical): No  Physical Activity: Insufficiently Active (07/21/2023)   Exercise Vital Sign    Days of Exercise per Week: 4 days    Minutes of  Exercise per Session: 30 min  Stress: No Stress Concern Present (07/21/2023)   [employer] of Occupational Health - Occupational Stress Questionnaire    Feeling of Stress : Only a little  Social Connections: Moderately Isolated (07/21/2023)   Social Connection and Isolation Panel [NHANES]    Frequency of Communication with Friends and Family: More than three times a week    Frequency of Social Gatherings with Friends and Family: More than three times a week    Attends Religious Services: Never    Database administrator or Organizations: Yes    Attends Engineer, structural: More than 4 times per year    Marital Status: Widowed  Intimate Partner Violence: Not At Risk (01/10/2023)   Humiliation, Afraid, Rape, and Kick questionnaire    Fear of Current or Ex-Partner: No    Emotionally Abused: No    Physically Abused: No    Sexually Abused: No    Family History  Problem Relation Age of Onset   Cancer - Ovarian Mother    Arthritis Father    Alzheimer's disease Sister    Arthritis Sister      Review of Systems  Constitutional: Negative.  Negative for chills and fever.  HENT: Negative.  Negative for congestion and sore throat.   Respiratory: Negative.  Negative for cough and shortness of breath.   Cardiovascular: Negative.  Negative for chest pain and palpitations.  Gastrointestinal:  Negative for abdominal pain, diarrhea, nausea and vomiting.  Genitourinary: Negative.  Negative for dysuria and hematuria.  Skin: Negative.  Negative for rash.  Neurological: Negative.  Negative for dizziness and headaches.  All other systems reviewed and are negative.   Vitals:   07/24/23 0850  BP: 118/74  Pulse: 73  Temp: 98.3 F (36.8 C)  SpO2: 97%    Physical Exam Vitals reviewed.  Constitutional:      Appearance: Normal appearance.  HENT:     Head: Normocephalic.     Mouth/Throat:     Mouth: Mucous membranes are moist.     Pharynx: Oropharynx is clear.  Eyes:      Extraocular Movements: Extraocular movements intact.     Conjunctiva/sclera: Conjunctivae normal.     Pupils: Pupils are equal, round, and reactive to light.  Neck:     Vascular: No carotid bruit.  Cardiovascular:     Rate and Rhythm: Normal rate and regular rhythm.     Pulses: Normal pulses.     Heart sounds: Normal heart sounds.  Pulmonary:     Effort: Pulmonary effort is normal.     Breath sounds: Normal breath sounds.  Musculoskeletal:  Cervical back: No tenderness.  Lymphadenopathy:     Cervical: No cervical adenopathy.  Skin:    General: Skin is warm and dry.     Capillary Refill: Capillary refill takes less than 2 seconds.  Neurological:     General: No focal deficit present.     Mental Status: She is alert and oriented to person, place, and time.  Psychiatric:        Mood and Affect: Mood normal.        Behavior: Behavior normal.    Results for orders placed or performed in visit on 07/24/23 (from the past 24 hours)  POCT HgB A1C     Status: None   Collection Time: 07/24/23  9:22 AM  Result Value Ref Range   Hemoglobin A1C 5.6 4.0 - 5.6 %   HbA1c POC (<> result, manual entry)     HbA1c, POC (prediabetic range)     HbA1c, POC (controlled diabetic range)    CBC with Differential/Platelet     Status: Abnormal   Collection Time: 07/24/23  9:31 AM  Result Value Ref Range   WBC 7.0 4.0 - 10.5 K/uL   RBC 4.15 3.87 - 5.11 Mil/uL   Hemoglobin 13.7 12.0 - 15.0 g/dL   HCT 16.1 09.6 - 04.5 %   MCV 97.8 78.0 - 100.0 fl   MCHC 33.6 30.0 - 36.0 g/dL   RDW 40.9 81.1 - 91.4 %   Platelets 289.0 150.0 - 400.0 K/uL   Neutrophils Relative % 35.6 (L) 43.0 - 77.0 %   Lymphocytes Relative 47.5 (H) 12.0 - 46.0 %   Monocytes Relative 11.4 3.0 - 12.0 %   Eosinophils Relative 4.7 0.0 - 5.0 %   Basophils Relative 0.8 0.0 - 3.0 %   Neutro Abs 2.5 1.4 - 7.7 K/uL   Lymphs Abs 3.3 0.7 - 4.0 K/uL   Monocytes Absolute 0.8 0.1 - 1.0 K/uL   Eosinophils Absolute 0.3 0.0 - 0.7 K/uL    Basophils Absolute 0.1 0.0 - 0.1 K/uL  Comprehensive metabolic panel     Status: None   Collection Time: 07/24/23  9:31 AM  Result Value Ref Range   Sodium 140 135 - 145 mEq/L   Potassium 4.6 3.5 - 5.1 mEq/L   Chloride 106 96 - 112 mEq/L   CO2 24 19 - 32 mEq/L   Glucose, Bld 97 70 - 99 mg/dL   BUN 9 6 - 23 mg/dL   Creatinine, Ser 7.82 0.40 - 1.20 mg/dL   Total Bilirubin 0.6 0.2 - 1.2 mg/dL   Alkaline Phosphatase 70 39 - 117 U/L   AST 20 0 - 37 U/L   ALT 14 0 - 35 U/L   Total Protein 7.1 6.0 - 8.3 g/dL   Albumin 3.9 3.5 - 5.2 g/dL   GFR 95.62 >13.08 mL/min   Calcium 9.1 8.4 - 10.5 mg/dL  Lipid panel     Status: None   Collection Time: 07/24/23  9:31 AM  Result Value Ref Range   Cholesterol 171 0 - 200 mg/dL   Triglycerides 65.7 0.0 - 149.0 mg/dL   HDL 84.69 >62.95 mg/dL   VLDL 28.4 0.0 - 13.2 mg/dL   LDL Cholesterol 77 0 - 99 mg/dL   Total CHOL/HDL Ratio 2    NonHDL 96.62      ASSESSMENT & PLAN: A total of 43 minutes was spent with the patient and counseling/coordination of care regarding preparing for this visit, review of most recent office visit notes, review  of multiple chronic medical conditions and their management, review of all medications, review of most recent bloodwork results, review of health maintenance items, education on nutrition, prognosis, documentation, and need for follow up.   Problem List Items Addressed This Visit       Cardiovascular and Mediastinum   Peripheral artery disease (HCC)   Clinically stable. Continues daily baby aspirin No signs of severe peripheral circulatory insufficiency        Nervous and Auditory   Polyneuropathy   Recently seen and evaluated by neurologist Did not tolerate gabapentin Recently prescribed Lyrica but has not started it yet. Clinically stable.  No concerns.        Other   History of TIA (transient ischemic attack)   History of osteoporosis   Stable. On weekly Fosamax 70 mg.       Dyslipidemia -  Primary   History of statin side effects in the past.  Reacted to atorvastatin. Recently prescribed WelChol but has not started History of peripheral artery disease and TIA in the past Tolerating rosuvastatin 10 mg Diet and nutrition discussed Lipid profile done today.      Relevant Orders   POCT HgB A1C   Lipid panel   Comprehensive metabolic panel   CBC with Differential/Platelet   Patient Instructions  Health Maintenance After Age 57 After age 34, you are at a higher risk for certain long-term diseases and infections as well as injuries from falls. Falls are a major cause of broken bones and head injuries in people who are older than age 38. Getting regular preventive care can help to keep you healthy and well. Preventive care includes getting regular testing and making lifestyle changes as recommended by your health care provider. Talk with your health care provider about: Which screenings and tests you should have. A screening is a test that checks for a disease when you have no symptoms. A diet and exercise plan that is right for you. What should I know about screenings and tests to prevent falls? Screening and testing are the best ways to find a health problem early. Early diagnosis and treatment give you the best chance of managing medical conditions that are common after age 22. Certain conditions and lifestyle choices may make you more likely to have a fall. Your health care provider may recommend: Regular vision checks. Poor vision and conditions such as cataracts can make you more likely to have a fall. If you wear glasses, make sure to get your prescription updated if your vision changes. Medicine review. Work with your health care provider to regularly review all of the medicines you are taking, including over-the-counter medicines. Ask your health care provider about any side effects that may make you more likely to have a fall. Tell your health care provider if any medicines  that you take make you feel dizzy or sleepy. Strength and balance checks. Your health care provider may recommend certain tests to check your strength and balance while standing, walking, or changing positions. Foot health exam. Foot pain and numbness, as well as not wearing proper footwear, can make you more likely to have a fall. Screenings, including: Osteoporosis screening. Osteoporosis is a condition that causes the bones to get weaker and break more easily. Blood pressure screening. Blood pressure changes and medicines to control blood pressure can make you feel dizzy. Depression screening. You may be more likely to have a fall if you have a fear of falling, feel depressed, or feel unable to do activities  that you used to do. Alcohol use screening. Using too much alcohol can affect your balance and may make you more likely to have a fall. Follow these instructions at home: Lifestyle Do not drink alcohol if: Your health care provider tells you not to drink. If you drink alcohol: Limit how much you have to: 0-1 drink a day for women. 0-2 drinks a day for men. Know how much alcohol is in your drink. In the [place] one drink equals one 12 oz bottle of beer (355 mL), one 5 oz glass of wine (148 mL), or one 1 oz glass of hard liquor (44 mL). Do not use any products that contain nicotine or tobacco. These products include cigarettes, chewing tobacco, and vaping devices, such as e-cigarettes. If you need help quitting, ask your health care provider. Activity  Follow a regular exercise program to stay fit. This will help you maintain your balance. Ask your health care provider what types of exercise are appropriate for you. If you need a cane or walker, use it as recommended by your health care provider. Wear supportive shoes that have nonskid soles. Safety  Remove any tripping hazards, such as rugs, cords, and clutter. Install safety equipment such as grab bars in bathrooms and safety rails on  stairs. Keep rooms and walkways well-lit. General instructions Talk with your health care provider about your risks for falling. Tell your health care provider if: You fall. Be sure to tell your health care provider about all falls, even ones that seem minor. You feel dizzy, tiredness (fatigue), or off-balance. Take over-the-counter and prescription medicines only as told by your health care provider. These include supplements. Eat a healthy diet and maintain a healthy weight. A healthy diet includes low-fat dairy products, low-fat (lean) meats, and fiber from whole grains, beans, and lots of fruits and vegetables. Stay current with your vaccines. Schedule regular health, dental, and eye exams. Summary Having a healthy lifestyle and getting preventive care can help to protect your health and wellness after age 33. Screening and testing are the best way to find a health problem early and help you avoid having a fall. Early diagnosis and treatment give you the best chance for managing medical conditions that are more common for people who are older than age 77. Falls are a major cause of broken bones and head injuries in people who are older than age 57. Take precautions to prevent a fall at home. Work with your health care provider to learn what changes you can make to improve your health and wellness and to prevent falls. This information is not intended to replace advice given to you by your health care provider. Make sure you discuss any questions you have with your health care provider. Document Revised: 12/12/2020 Document Reviewed: 12/12/2020 Elsevier Patient Education  2024 Elsevier Inc.      Edwina Barth, MD [facility]

## 2023-07-24 NOTE — Assessment & Plan Note (Signed)
Clinically stable. Continues daily baby aspirin No signs of severe peripheral circulatory insufficiency

## 2023-07-24 NOTE — Patient Instructions (Signed)
Health Maintenance After Age 84 After age 84, you are at a higher risk for certain long-term diseases and infections as well as injuries from falls. Falls are a major cause of broken bones and head injuries in people who are older than age 84. Getting regular preventive care can help to keep you healthy and well. Preventive care includes getting regular testing and making lifestyle changes as recommended by your health care provider. Talk with your health care provider about: Which screenings and tests you should have. A screening is a test that checks for a disease when you have no symptoms. A diet and exercise plan that is right for you. What should I know about screenings and tests to prevent falls? Screening and testing are the best ways to find a health problem early. Early diagnosis and treatment give you the best chance of managing medical conditions that are common after age 84. Certain conditions and lifestyle choices may make you more likely to have a fall. Your health care provider may recommend: Regular vision checks. Poor vision and conditions such as cataracts can make you more likely to have a fall. If you wear glasses, make sure to get your prescription updated if your vision changes. Medicine review. Work with your health care provider to regularly review all of the medicines you are taking, including over-the-counter medicines. Ask your health care provider about any side effects that may make you more likely to have a fall. Tell your health care provider if any medicines that you take make you feel dizzy or sleepy. Strength and balance checks. Your health care provider may recommend certain tests to check your strength and balance while standing, walking, or changing positions. Foot health exam. Foot pain and numbness, as well as not wearing proper footwear, can make you more likely to have a fall. Screenings, including: Osteoporosis screening. Osteoporosis is a condition that causes  the bones to get weaker and break more easily. Blood pressure screening. Blood pressure changes and medicines to control blood pressure can make you feel dizzy. Depression screening. You may be more likely to have a fall if you have a fear of falling, feel depressed, or feel unable to do activities that you used to do. Alcohol use screening. Using too much alcohol can affect your balance and may make you more likely to have a fall. Follow these instructions at home: Lifestyle Do not drink alcohol if: Your health care provider tells you not to drink. If you drink alcohol: Limit how much you have to: 0-1 drink a day for women. 0-2 drinks a day for men. Know how much alcohol is in your drink. In the [place] one drink equals one 12 oz bottle of beer (355 mL), one 5 oz glass of wine (148 mL), or one 1 oz glass of hard liquor (44 mL). Do not use any products that contain nicotine or tobacco. These products include cigarettes, chewing tobacco, and vaping devices, such as e-cigarettes. If you need help quitting, ask your health care provider. Activity  Follow a regular exercise program to stay fit. This will help you maintain your balance. Ask your health care provider what types of exercise are appropriate for you. If you need a cane or walker, use it as recommended by your health care provider. Wear supportive shoes that have nonskid soles. Safety  Remove any tripping hazards, such as rugs, cords, and clutter. Install safety equipment such as grab bars in bathrooms and safety rails on stairs. Keep rooms and walkways   well-lit. General instructions Talk with your health care provider about your risks for falling. Tell your health care provider if: You fall. Be sure to tell your health care provider about all falls, even ones that seem minor. You feel dizzy, tiredness (fatigue), or off-balance. Take over-the-counter and prescription medicines only as told by your health care provider. These include  supplements. Eat a healthy diet and maintain a healthy weight. A healthy diet includes low-fat dairy products, low-fat (lean) meats, and fiber from whole grains, beans, and lots of fruits and vegetables. Stay current with your vaccines. Schedule regular health, dental, and eye exams. Summary Having a healthy lifestyle and getting preventive care can help to protect your health and wellness after age 84. Screening and testing are the best way to find a health problem early and help you avoid having a fall. Early diagnosis and treatment give you the best chance for managing medical conditions that are more common for people who are older than age 84. Falls are a major cause of broken bones and head injuries in people who are older than age 84. Take precautions to prevent a fall at home. Work with your health care provider to learn what changes you can make to improve your health and wellness and to prevent falls. This information is not intended to replace advice given to you by your health care provider. Make sure you discuss any questions you have with your health care provider. Document Revised: 12/12/2020 Document Reviewed: 12/12/2020 Elsevier Patient Education  2024 Elsevier Inc.  

## 2023-07-24 NOTE — Assessment & Plan Note (Signed)
Recently seen and evaluated by neurologist Did not tolerate gabapentin Recently prescribed Lyrica but has not started it yet. Clinically stable.  No concerns.

## 2023-07-24 NOTE — Assessment & Plan Note (Addendum)
History of statin side effects in the past.  Reacted to atorvastatin. Recently prescribed WelChol but has not started History of peripheral artery disease and TIA in the past Tolerating rosuvastatin 10 mg Diet and nutrition discussed Lipid profile done today.

## 2023-08-08 ENCOUNTER — Encounter: Payer: Self-pay | Admitting: Emergency Medicine

## 2023-08-10 NOTE — Telephone Encounter (Signed)
 It is okay to stop rosuvastatin and continue improving diet and nutrition.  Thanks.

## 2023-08-21 ENCOUNTER — Other Ambulatory Visit: Payer: Self-pay | Admitting: Emergency Medicine

## 2023-08-21 DIAGNOSIS — Z1231 Encounter for screening mammogram for malignant neoplasm of breast: Secondary | ICD-10-CM

## 2023-09-02 DIAGNOSIS — K432 Incisional hernia without obstruction or gangrene: Secondary | ICD-10-CM | POA: Diagnosis not present

## 2023-09-26 ENCOUNTER — Other Ambulatory Visit: Payer: Self-pay | Admitting: Internal Medicine

## 2023-09-26 ENCOUNTER — Other Ambulatory Visit: Payer: Medicare Other

## 2023-09-26 DIAGNOSIS — Z13820 Encounter for screening for osteoporosis: Secondary | ICD-10-CM

## 2023-09-27 NOTE — Progress Notes (Deleted)
 Note created in error.

## 2023-10-04 ENCOUNTER — Encounter: Payer: Self-pay | Admitting: Emergency Medicine

## 2023-10-07 ENCOUNTER — Other Ambulatory Visit: Payer: Self-pay | Admitting: Radiology

## 2023-10-07 DIAGNOSIS — E785 Hyperlipidemia, unspecified: Secondary | ICD-10-CM

## 2023-10-07 NOTE — Telephone Encounter (Signed)
 Okay to order future lipid profile blood test.  Thanks.

## 2023-10-09 ENCOUNTER — Other Ambulatory Visit (INDEPENDENT_AMBULATORY_CARE_PROVIDER_SITE_OTHER)

## 2023-10-09 ENCOUNTER — Other Ambulatory Visit: Payer: Self-pay | Admitting: Emergency Medicine

## 2023-10-09 ENCOUNTER — Encounter: Payer: Self-pay | Admitting: Emergency Medicine

## 2023-10-09 DIAGNOSIS — R300 Dysuria: Secondary | ICD-10-CM | POA: Diagnosis not present

## 2023-10-09 DIAGNOSIS — E785 Hyperlipidemia, unspecified: Secondary | ICD-10-CM | POA: Diagnosis not present

## 2023-10-09 LAB — URINALYSIS
Bilirubin Urine: NEGATIVE
Hgb urine dipstick: NEGATIVE
Ketones, ur: NEGATIVE
Leukocytes,Ua: NEGATIVE
Nitrite: NEGATIVE
Specific Gravity, Urine: <=1.005 — AB (ref 1.000–1.030)
Total Protein, Urine: NEGATIVE
Urine Glucose: NEGATIVE
Urobilinogen, UA: 0.2 (ref 0.0–1.0)
pH: 6.5 (ref 5.0–8.0)

## 2023-10-09 LAB — LIPID PANEL
Cholesterol: 239 mg/dL — ABNORMAL HIGH (ref 0–200)
HDL: 80 mg/dL (ref >39.00)
LDL Cholesterol: 133 mg/dL — ABNORMAL HIGH (ref 0–99)
NonHDL: 158.6
Total CHOL/HDL Ratio: 3
Triglycerides: 126 mg/dL (ref 0.0–149.0)
VLDL: 25.2 mg/dL (ref 0.0–40.0)

## 2023-10-10 LAB — URINE CULTURE: Result:: NO GROWTH

## 2023-10-16 ENCOUNTER — Ambulatory Visit: Payer: Medicare Other | Admitting: Neurology

## 2023-11-02 ENCOUNTER — Other Ambulatory Visit: Payer: Self-pay | Admitting: Emergency Medicine

## 2023-11-03 ENCOUNTER — Encounter: Payer: Self-pay | Admitting: Neurology

## 2023-11-13 ENCOUNTER — Ambulatory Visit: Payer: Medicare HMO

## 2023-11-24 ENCOUNTER — Other Ambulatory Visit: Payer: Self-pay | Admitting: Emergency Medicine

## 2023-11-28 ENCOUNTER — Ambulatory Visit: Admitting: Emergency Medicine

## 2023-12-02 ENCOUNTER — Encounter: Payer: Self-pay | Admitting: Emergency Medicine

## 2023-12-05 ENCOUNTER — Ambulatory Visit: Admitting: Emergency Medicine

## 2023-12-09 DIAGNOSIS — C7A8 Other malignant neuroendocrine tumors: Secondary | ICD-10-CM | POA: Diagnosis not present

## 2023-12-09 DIAGNOSIS — K862 Cyst of pancreas: Secondary | ICD-10-CM | POA: Diagnosis not present

## 2023-12-09 DIAGNOSIS — Z87891 Personal history of nicotine dependence: Secondary | ICD-10-CM | POA: Diagnosis not present

## 2023-12-09 DIAGNOSIS — K439 Ventral hernia without obstruction or gangrene: Secondary | ICD-10-CM | POA: Diagnosis not present

## 2023-12-17 ENCOUNTER — Encounter: Payer: Self-pay | Admitting: Emergency Medicine

## 2023-12-17 NOTE — Telephone Encounter (Signed)
Recommend office visit.  Thanks.

## 2023-12-18 DIAGNOSIS — C44729 Squamous cell carcinoma of skin of left lower limb, including hip: Secondary | ICD-10-CM | POA: Diagnosis not present

## 2023-12-18 DIAGNOSIS — D485 Neoplasm of uncertain behavior of skin: Secondary | ICD-10-CM | POA: Diagnosis not present

## 2023-12-23 ENCOUNTER — Ambulatory Visit: Admitting: Emergency Medicine

## 2023-12-23 ENCOUNTER — Encounter: Payer: Self-pay | Admitting: Emergency Medicine

## 2023-12-23 VITALS — BP 120/68 | HR 81 | Temp 98.5°F | Ht 65.0 in | Wt 181.0 lb

## 2023-12-23 DIAGNOSIS — G629 Polyneuropathy, unspecified: Secondary | ICD-10-CM

## 2023-12-23 DIAGNOSIS — M791 Myalgia, unspecified site: Secondary | ICD-10-CM | POA: Diagnosis not present

## 2023-12-23 DIAGNOSIS — R42 Dizziness and giddiness: Secondary | ICD-10-CM

## 2023-12-23 DIAGNOSIS — I739 Peripheral vascular disease, unspecified: Secondary | ICD-10-CM

## 2023-12-23 DIAGNOSIS — E785 Hyperlipidemia, unspecified: Secondary | ICD-10-CM | POA: Diagnosis not present

## 2023-12-23 NOTE — Assessment & Plan Note (Signed)
 Recently seen and evaluated by neurologist Did not tolerate gabapentin Recently prescribed Lyrica but has not started it yet. Clinically stable.  No concerns.

## 2023-12-23 NOTE — Progress Notes (Signed)
 Nancy Duncan 83 y.o.   Chief Complaint  Patient presents with   muscle soreness     Patient is having muscle soreness in her both of her arms for a couple of weeks. Patient mentions having some vertigo feelings that comes and goes     HISTORY OF PRESENT ILLNESS: This is a 83 y.o. female complaining of soreness to both arms for couple weeks This is a recurrent problem.  May be medication related Also has a history of chronic dizziness and vertigo that comes and goes for the last several weeks. History of dyslipidemia, intolerant to statins.  Was recently prescribed WelChol  but has not started it yet. No other associated symptoms. No other complaints or medical concerns today. Normal recent CBC and CMP.  Results reviewed with patient. BP Readings from Last 3 Encounters:  07/24/23 118/74  07/03/23 120/72  05/16/23 120/68     HPI   Prior to Admission medications   Medication Sig Start Date End Date Taking? Authorizing Provider  alendronate  (FOSAMAX ) 70 MG tablet TAKE 1 TABLET BY MOUTH ONCE A WEEK 11/24/23  Yes Murray Guzzetta Jose, MD  ASPIRIN 81 PO Take by mouth.   Yes [provider]  citalopram  (CELEXA ) 10 MG tablet TAKE 1 TABLET BY MOUTH DAILY 01/05/23  Yes Fleur Audino, Isidro Margo, MD  colesevelam  (WELCHOL ) 625 MG tablet Take 2 tablets (1,250 mg total) by mouth 2 (two) times daily with a meal. Patient not taking: Reported on 12/23/2023 03/28/23   Sater, Sherida Dimmer, MD  DULoxetine  (CYMBALTA ) 30 MG capsule Take 1 capsule (30 mg total) by mouth daily. 06/26/23   Sater, Sherida Dimmer, MD  hydrOXYzine  (ATARAX ) 25 MG tablet TAKE 1 TABLET(25 MG) BY MOUTH AT BEDTIME AS NEEDED Patient not taking: Reported on 12/23/2023 11/03/23   Claudeen Leason Jose, MD  meclizine  (ANTIVERT ) 12.5 MG tablet Take 1 tablet (12.5 mg total) by mouth 3 (three) times daily as needed for dizziness. Patient not taking: Reported on 12/23/2023 08/14/22   Elvira Hammersmith, MD  rosuvastatin  (CRESTOR ) 10 MG tablet  Take 1 tablet (10 mg total) by mouth daily. 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   Ventral hernia without obstruction or gangrene 08/02/2021   TIA (transient ischemic attack) 06/15/2021    Past Medical History:  Diagnosis Date   Breast cancer (HCC)    Cancer (HCC)    Breast Cancer   Pancreatic tumor    Personal history of radiation therapy     Past Surgical History:  Procedure Laterality Date   APPENDECTOMY     BREAST LUMPECTOMY     SPLENECTOMY, TOTAL      Social History   Socioeconomic History   Marital status: Widowed    Spouse name: Not on file   Number of children: 3   Years of education: Not on file   Highest education level: Master's degree (e.g., MA, MS, MEng, MEd, MSW, MBA)  Occupational History  Not on file  Tobacco Use   Smoking status: Former    Types: Cigarettes   Smokeless tobacco: Never  Vaping Use   Vaping status: Never Used  Substance and Sexual Activity   Alcohol use: Yes    Comment: rare   Drug use: Never   Sexual activity: Not on file  Other Topics Concern   Not on file  Social History Narrative   Right handed    Caffeine  cup per day   Lives at home alone    Social Drivers of Health   Financial Resource Strain: Low Risk  (07/21/2023)   Overall Financial Resource Strain (CARDIA)    Difficulty of Paying Living Expenses: Not hard at all  Food Insecurity: No Food Insecurity  (07/21/2023)   Hunger Vital Sign    Worried About Running Out of Food in the Last Year: Never true    Ran Out of Food in the Last Year: Never true  Transportation Needs: No Transportation Needs (07/21/2023)   PRAPARE - Administrator, Civil Service (Medical): No    Lack of Transportation (Non-Medical): No  Physical Activity: Insufficiently Active (07/21/2023)   Exercise Vital Sign    Days of Exercise per Week: 4 days    Minutes of Exercise per Session: 30 min  Stress: No Stress Concern Present (07/21/2023)   [employer] of Occupational Health - Occupational Stress Questionnaire    Feeling of Stress : Only a little  Social Connections: Moderately Isolated (07/21/2023)   Social Connection and Isolation Panel [NHANES]    Frequency of Communication with Friends and Family: More than three times a week    Frequency of Social Gatherings with Friends and Family: More than three times a week    Attends Religious Services: Never    Database administrator or Organizations: Yes    Attends Engineer, structural: More than 4 times per year    Marital Status: Widowed  Intimate Partner Violence: Not At Risk (01/10/2023)   Humiliation, Afraid, Rape, and Kick questionnaire    Fear of Current or Ex-Partner: No    Emotionally Abused: No    Physically Abused: No    Sexually Abused: No    Family History  Problem Relation Age of Onset   Cancer - Ovarian Mother    Arthritis Father    Alzheimer's disease Sister    Arthritis Sister      Review of Systems  Constitutional: Negative.  Negative for chills and fever.  HENT: Negative.  Negative for congestion and sore throat.   Respiratory: Negative.  Negative for cough and shortness of breath.   Cardiovascular: Negative.  Negative for chest pain and palpitations.  Gastrointestinal:  Negative for abdominal pain, diarrhea, nausea and vomiting.  Genitourinary: Negative.  Negative for dysuria and hematuria.  Skin: Negative.   Negative for rash.  Neurological: Negative.  Negative for dizziness and headaches.  All other systems reviewed and are negative.   Today's Vitals   12/23/23 1301  BP: 120/68  Pulse: 81  Temp: 98.5 F (36.9 C)  TempSrc: Oral  SpO2: 98%  Weight: 181 lb (82.1 kg)  Height: 5\' 5"  (1.651 m)   Body mass index is 30.12 kg/m.   Physical Exam Vitals reviewed.  Constitutional:      Appearance: Normal appearance.  HENT:     Head: Normocephalic.     Right Ear: Tympanic membrane, ear canal and external ear normal.     Left Ear: Tympanic membrane, ear canal  and external ear normal.     Mouth/Throat:     Mouth: Mucous membranes are moist.     Pharynx: Oropharynx is clear.  Eyes:     Extraocular Movements: Extraocular movements intact.     Conjunctiva/sclera: Conjunctivae normal.     Pupils: Pupils are equal, round, and reactive to light.  Cardiovascular:     Rate and Rhythm: Normal rate and regular rhythm.     Pulses: Normal pulses.     Heart sounds: Normal heart sounds.  Pulmonary:     Effort: Pulmonary effort is normal.     Breath sounds: Normal breath sounds.  Musculoskeletal:     Cervical back: No tenderness.     Right lower leg: No edema.     Left lower leg: No edema.  Lymphadenopathy:     Cervical: No cervical adenopathy.  Skin:    General: Skin is warm and dry.     Capillary Refill: Capillary refill takes less than 2 seconds.  Neurological:     General: No focal deficit present.     Mental Status: She is alert and oriented to person, place, and time.  Psychiatric:        Mood and Affect: Mood normal.        Behavior: Behavior normal.      ASSESSMENT & PLAN: A total of 42 was spent with the patient and counseling/coordination of care regarding preparing for this visit, review of most recent office visit notes, review of multiple chronic medical conditions and their management, review of all medications, review of most recent bloodwork results, review of health  maintenance items, education on nutrition, prognosis, documentation, and need for follow up.   Problem List Items Addressed This Visit       Cardiovascular and Mediastinum   Peripheral artery disease (HCC)   Clinically stable. Continues daily baby aspirin No signs of severe peripheral circulatory insufficiency        Nervous and Auditory   Polyneuropathy   Recently seen and evaluated by neurologist Did not tolerate gabapentin  Recently prescribed Lyrica  but has not started it yet. Clinically stable.  No concerns.        Other   Chronic vertigo   Clinically stable.  No red flag signs or symptoms. No new medications.  Stable vital signs.  Normal neurological examination. No signs of stroke on physical examination. Has history of vertigo in the past.  Feels the same.  No new symptomatology.  Meclizine  helps.  Recommend to continue meclizine  every 6-8 hours as needed. ED precautions given. Advised to contact the office if no better or worse during the next several days.      Dyslipidemia   History of statin side effects in the past.  Reacted to atorvastatin . Recently prescribed WelChol  but has not started History of peripheral artery disease and TIA in the past Tolerating rosuvastatin  10 mg Diet and nutrition discussed      Muscle soreness - Primary   Clinically stable.  No red flag signs or symptoms. Not taking any cholesterol medication at present time Not taking any medications associated with muscle joint soreness. Differential diagnosis discussed Normal recent CMP and CBC from 12/09/2023 Unremarkable physical examination. No concerns identified today.      Patient Instructions  Health Maintenance After Age 71 After age 58, you are at a higher risk for certain long-term diseases and infections as well as injuries from falls. Falls are a major cause of broken bones and head injuries in people who are  older than age 68. Getting regular preventive care can help to keep  you healthy and well. Preventive care includes getting regular testing and making lifestyle changes as recommended by your health care provider. Talk with your health care provider about: Which screenings and tests you should have. A screening is a test that checks for a disease when you have no symptoms. A diet and exercise plan that is right for you. What should I know about screenings and tests to prevent falls? Screening and testing are the best ways to find a health problem early. Early diagnosis and treatment give you the best chance of managing medical conditions that are common after age 55. Certain conditions and lifestyle choices may make you more likely to have a fall. Your health care provider may recommend: Regular vision checks. Poor vision and conditions such as cataracts can make you more likely to have a fall. If you wear glasses, make sure to get your prescription updated if your vision changes. Medicine review. Work with your health care provider to regularly review all of the medicines you are taking, including over-the-counter medicines. Ask your health care provider about any side effects that may make you more likely to have a fall. Tell your health care provider if any medicines that you take make you feel dizzy or sleepy. Strength and balance checks. Your health care provider may recommend certain tests to check your strength and balance while standing, walking, or changing positions. Foot health exam. Foot pain and numbness, as well as not wearing proper footwear, can make you more likely to have a fall. Screenings, including: Osteoporosis screening. Osteoporosis is a condition that causes the bones to get weaker and break more easily. Blood pressure screening. Blood pressure changes and medicines to control blood pressure can make you feel dizzy. Depression screening. You may be more likely to have a fall if you have a fear of falling, feel depressed, or feel unable to do  activities that you used to do. Alcohol use screening. Using too much alcohol can affect your balance and may make you more likely to have a fall. Follow these instructions at home: Lifestyle Do not drink alcohol if: Your health care provider tells you not to drink. If you drink alcohol: Limit how much you have to: 0-1 drink a day for women. 0-2 drinks a day for men. Know how much alcohol is in your drink. In the [place] one drink equals one 12 oz bottle of beer (355 mL), one 5 oz glass of wine (148 mL), or one 1 oz glass of hard liquor (44 mL). Do not use any products that contain nicotine or tobacco. These products include cigarettes, chewing tobacco, and vaping devices, such as e-cigarettes. If you need help quitting, ask your health care provider. Activity  Follow a regular exercise program to stay fit. This will help you maintain your balance. Ask your health care provider what types of exercise are appropriate for you. If you need a cane or walker, use it as recommended by your health care provider. Wear supportive shoes that have nonskid soles. Safety  Remove any tripping hazards, such as rugs, cords, and clutter. Install safety equipment such as grab bars in bathrooms and safety rails on stairs. Keep rooms and walkways well-lit. General instructions Talk with your health care provider about your risks for falling. Tell your health care provider if: You fall. Be sure to tell your health care provider about all falls, even ones that seem minor. You feel  dizzy, tiredness (fatigue), or off-balance. Take over-the-counter and prescription medicines only as told by your health care provider. These include supplements. Eat a healthy diet and maintain a healthy weight. A healthy diet includes low-fat dairy products, low-fat (lean) meats, and fiber from whole grains, beans, and lots of fruits and vegetables. Stay current with your vaccines. Schedule regular health, dental, and eye  exams. Summary Having a healthy lifestyle and getting preventive care can help to protect your health and wellness after age 61. Screening and testing are the best way to find a health problem early and help you avoid having a fall. Early diagnosis and treatment give you the best chance for managing medical conditions that are more common for people who are older than age 100. Falls are a major cause of broken bones and head injuries in people who are older than age 32. Take precautions to prevent a fall at home. Work with your health care provider to learn what changes you can make to improve your health and wellness and to prevent falls. This information is not intended to replace advice given to you by your health care provider. Make sure you discuss any questions you have with your health care provider. Document Revised: 12/12/2020 Document Reviewed: 12/12/2020 Elsevier Patient Education  2024 [employer] Inc.  Maryagnes Small, MD  Primary Care at [facility]

## 2023-12-23 NOTE — Patient Instructions (Signed)
 Health Maintenance After Age 85 After age 4, you are at a higher risk for certain long-term diseases and infections as well as injuries from falls. Falls are a major cause of broken bones and head injuries in people who are older than age 47. Getting regular preventive care can help to keep you healthy and well. Preventive care includes getting regular testing and making lifestyle changes as recommended by your health care provider. Talk with your health care provider about: Which screenings and tests you should have. A screening is a test that checks for a disease when you have no symptoms. A diet and exercise plan that is right for you. What should I know about screenings and tests to prevent falls? Screening and testing are the best ways to find a health problem early. Early diagnosis and treatment give you the best chance of managing medical conditions that are common after age 37. Certain conditions and lifestyle choices may make you more likely to have a fall. Your health care provider may recommend: Regular vision checks. Poor vision and conditions such as cataracts can make you more likely to have a fall. If you wear glasses, make sure to get your prescription updated if your vision changes. Medicine review. Work with your health care provider to regularly review all of the medicines you are taking, including over-the-counter medicines. Ask your health care provider about any side effects that may make you more likely to have a fall. Tell your health care provider if any medicines that you take make you feel dizzy or sleepy. Strength and balance checks. Your health care provider may recommend certain tests to check your strength and balance while standing, walking, or changing positions. Foot health exam. Foot pain and numbness, as well as not wearing proper footwear, can make you more likely to have a fall. Screenings, including: Osteoporosis screening. Osteoporosis is a condition that causes  the bones to get weaker and break more easily. Blood pressure screening. Blood pressure changes and medicines to control blood pressure can make you feel dizzy. Depression screening. You may be more likely to have a fall if you have a fear of falling, feel depressed, or feel unable to do activities that you used to do. Alcohol use screening. Using too much alcohol can affect your balance and may make you more likely to have a fall. Follow these instructions at home: Lifestyle Do not drink alcohol if: Your health care provider tells you not to drink. If you drink alcohol: Limit how much you have to: 0-1 drink a day for women. 0-2 drinks a day for men. Know how much alcohol is in your drink. In the [place] one drink equals one 12 oz bottle of beer (355 mL), one 5 oz glass of wine (148 mL), or one 1 oz glass of hard liquor (44 mL). Do not use any products that contain nicotine or tobacco. These products include cigarettes, chewing tobacco, and vaping devices, such as e-cigarettes. If you need help quitting, ask your health care provider. Activity  Follow a regular exercise program to stay fit. This will help you maintain your balance. Ask your health care provider what types of exercise are appropriate for you. If you need a cane or walker, use it as recommended by your health care provider. Wear supportive shoes that have nonskid soles. Safety  Remove any tripping hazards, such as rugs, cords, and clutter. Install safety equipment such as grab bars in bathrooms and safety rails on stairs. Keep rooms and walkways  well-lit. General instructions Talk with your health care provider about your risks for falling. Tell your health care provider if: You fall. Be sure to tell your health care provider about all falls, even ones that seem minor. You feel dizzy, tiredness (fatigue), or off-balance. Take over-the-counter and prescription medicines only as told by your health care provider. These include  supplements. Eat a healthy diet and maintain a healthy weight. A healthy diet includes low-fat dairy products, low-fat (lean) meats, and fiber from whole grains, beans, and lots of fruits and vegetables. Stay current with your vaccines. Schedule regular health, dental, and eye exams. Summary Having a healthy lifestyle and getting preventive care can help to protect your health and wellness after age 11. Screening and testing are the best way to find a health problem early and help you avoid having a fall. Early diagnosis and treatment give you the best chance for managing medical conditions that are more common for people who are older than age 28. Falls are a major cause of broken bones and head injuries in people who are older than age 48. Take precautions to prevent a fall at home. Work with your health care provider to learn what changes you can make to improve your health and wellness and to prevent falls. This information is not intended to replace advice given to you by your health care provider. Make sure you discuss any questions you have with your health care provider. Document Revised: 12/12/2020 Document Reviewed: 12/12/2020 Elsevier Patient Education  2024 [employer].

## 2023-12-23 NOTE — Assessment & Plan Note (Signed)
 History of statin side effects in the past.  Reacted to atorvastatin . Recently prescribed WelChol  but has not started History of peripheral artery disease and TIA in the past Tolerating rosuvastatin  10 mg Diet and nutrition discussed

## 2023-12-23 NOTE — Assessment & Plan Note (Signed)
Clinically stable. Continues daily baby aspirin No signs of severe peripheral circulatory insufficiency

## 2023-12-23 NOTE — Assessment & Plan Note (Signed)
 Clinically stable.  No red flag signs or symptoms. No new medications.  Stable vital signs.  Normal neurological examination. No signs of stroke on physical examination. Has history of vertigo in the past.  Feels the same.  No new symptomatology.  Meclizine  helps.  Recommend to continue meclizine  every 6-8 hours as needed. ED precautions given. Advised to contact the office if no better or worse during the next several days.

## 2023-12-23 NOTE — Assessment & Plan Note (Signed)
 Clinically stable.  No red flag signs or symptoms. Not taking any cholesterol medication at present time Not taking any medications associated with muscle joint soreness. Differential diagnosis discussed Normal recent CMP and CBC from 12/09/2023 Unremarkable physical examination. No concerns identified today.

## 2023-12-25 ENCOUNTER — Ambulatory Visit
Admission: RE | Admit: 2023-12-25 | Discharge: 2023-12-25 | Disposition: A | Discharge status: Home / Self Care | Source: Ambulatory Visit | Attending: Emergency Medicine | Admitting: Emergency Medicine

## 2023-12-25 DIAGNOSIS — Z1231 Encounter for screening mammogram for malignant neoplasm of breast: Secondary | ICD-10-CM

## 2023-12-27 ENCOUNTER — Ambulatory Visit: Payer: Self-pay | Admitting: Emergency Medicine

## 2024-01-02 DIAGNOSIS — K432 Incisional hernia without obstruction or gangrene: Secondary | ICD-10-CM | POA: Diagnosis not present

## 2024-01-07 DIAGNOSIS — I872 Venous insufficiency (chronic) (peripheral): Secondary | ICD-10-CM | POA: Diagnosis not present

## 2024-01-07 DIAGNOSIS — L821 Other seborrheic keratosis: Secondary | ICD-10-CM | POA: Diagnosis not present

## 2024-01-07 DIAGNOSIS — C44729 Squamous cell carcinoma of skin of left lower limb, including hip: Secondary | ICD-10-CM | POA: Diagnosis not present

## 2024-01-09 ENCOUNTER — Other Ambulatory Visit: Payer: Self-pay | Admitting: Medical Genetics

## 2024-01-11 ENCOUNTER — Other Ambulatory Visit: Payer: Self-pay | Admitting: Emergency Medicine

## 2024-01-15 ENCOUNTER — Encounter: Payer: Self-pay | Admitting: Neurology

## 2024-01-16 ENCOUNTER — Ambulatory Visit: Payer: Medicare Other

## 2024-01-20 ENCOUNTER — Encounter: Payer: Self-pay | Admitting: Emergency Medicine

## 2024-01-20 NOTE — Telephone Encounter (Signed)
 Yes

## 2024-01-22 ENCOUNTER — Ambulatory Visit: Payer: Self-pay | Admitting: Emergency Medicine

## 2024-01-22 ENCOUNTER — Ambulatory Visit (INDEPENDENT_AMBULATORY_CARE_PROVIDER_SITE_OTHER): Payer: Medicare HMO | Admitting: Emergency Medicine

## 2024-01-22 ENCOUNTER — Encounter: Payer: Self-pay | Admitting: Emergency Medicine

## 2024-01-22 VITALS — BP 126/78 | HR 81 | Temp 98.8°F | Ht 65.0 in | Wt 183.0 lb

## 2024-01-22 DIAGNOSIS — Z8739 Personal history of other diseases of the musculoskeletal system and connective tissue: Secondary | ICD-10-CM | POA: Diagnosis not present

## 2024-01-22 DIAGNOSIS — M791 Myalgia, unspecified site: Secondary | ICD-10-CM | POA: Diagnosis not present

## 2024-01-22 DIAGNOSIS — I739 Peripheral vascular disease, unspecified: Secondary | ICD-10-CM | POA: Diagnosis not present

## 2024-01-22 DIAGNOSIS — E785 Hyperlipidemia, unspecified: Secondary | ICD-10-CM | POA: Diagnosis not present

## 2024-01-22 LAB — COMPREHENSIVE METABOLIC PANEL WITH GFR
ALT: 13 U/L (ref 0–35)
AST: 17 U/L (ref 0–37)
Albumin: 4.2 g/dL (ref 3.5–5.2)
Alkaline Phosphatase: 79 U/L (ref 39–117)
BUN: 13 mg/dL (ref 6–23)
CO2: 26 meq/L (ref 19–32)
Calcium: 9.1 mg/dL (ref 8.4–10.5)
Chloride: 103 meq/L (ref 96–112)
Creatinine, Ser: 0.72 mg/dL (ref 0.40–1.20)
GFR: 76.61 mL/min (ref >60.00)
Glucose, Bld: 95 mg/dL (ref 70–99)
Potassium: 4.4 meq/L (ref 3.5–5.1)
Sodium: 136 meq/L (ref 135–145)
Total Bilirubin: 0.5 mg/dL (ref 0.2–1.2)
Total Protein: 7.2 g/dL (ref 6.0–8.3)

## 2024-01-22 LAB — LIPID PANEL
Cholesterol: 229 mg/dL — ABNORMAL HIGH (ref 0–200)
HDL: 68.4 mg/dL (ref >39.00)
LDL Cholesterol: 114 mg/dL — ABNORMAL HIGH (ref 0–99)
NonHDL: 160.29
Total CHOL/HDL Ratio: 3
Triglycerides: 233 mg/dL — ABNORMAL HIGH (ref 0.0–149.0)
VLDL: 46.6 mg/dL — ABNORMAL HIGH (ref 0.0–40.0)

## 2024-01-22 NOTE — Assessment & Plan Note (Signed)
 History of statin side effects in the past.  Reacted to atorvastatin . Recently prescribed WelChol  but has not started History of peripheral artery disease and TIA in the past Not taking any medication at present time. Diet and nutrition discussed

## 2024-01-22 NOTE — Assessment & Plan Note (Signed)
Clinically stable. Continues daily baby aspirin No signs of severe peripheral circulatory insufficiency

## 2024-01-22 NOTE — Assessment & Plan Note (Signed)
 Clinically stable.  No red flag signs or symptoms. Not taking any cholesterol medication at present time Not taking any medications associated with muscle joint soreness. Differential diagnosis discussed Normal recent CMP and CBC from 12/09/2023 Unremarkable physical examination. No concerns identified today.

## 2024-01-22 NOTE — Assessment & Plan Note (Signed)
Stable.  On weekly Fosamax 70 mg.

## 2024-01-22 NOTE — Progress Notes (Signed)
 Nancy Duncan 83 y.o.   Chief Complaint  Patient presents with   Follow-up    Patient here for 6 month f/u. Patient wants to talk about her medications     HISTORY OF PRESENT ILLNESS: This is a 83 y.o. female here for 71-month follow-up of chronic medical conditions.  HPI   Prior to Admission medications   Medication Sig Start Date End Date Taking? Authorizing Provider  alendronate  (FOSAMAX ) 70 MG tablet TAKE 1 TABLET BY MOUTH ONCE A WEEK 11/24/23  Yes Izea Livolsi Jose, MD  ASPIRIN 81 PO Take by mouth.   Yes [provider]  citalopram  (CELEXA ) 10 MG tablet TAKE 1 TABLET BY MOUTH DAILY 01/12/24  Yes Dawanna Grauberger, Isidro Margo, MD  meclizine  (ANTIVERT ) 12.5 MG tablet Take 1 tablet (12.5 mg total) by mouth 3 (three) times daily as needed for dizziness. 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/2021   Bilateral carpal tunnel syndrome 08/16/2021   History of TIA (transient ischemic attack) 08/02/2021   Pancreatic tumor 08/02/2021   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   Ventral hernia without obstruction or gangrene 08/02/2021   TIA (transient ischemic attack) 06/15/2021    Past Medical History:  Diagnosis Date   Breast cancer (HCC)    Cancer (HCC)    Breast Cancer   Pancreatic tumor    Personal history of radiation therapy     Past Surgical History:  Procedure Laterality Date   APPENDECTOMY     BREAST LUMPECTOMY     SPLENECTOMY, TOTAL      Social History   Socioeconomic History   Marital status: Widowed    Spouse name: Not on  file   Number of children: 3   Years of education: Not on file   Highest education level: Master's degree (e.g., MA, MS, MEng, MEd, MSW, MBA)  Occupational History   Not on file  Tobacco Use   Smoking status: Former    Types: Cigarettes   Smokeless tobacco: Never  Vaping Use   Vaping status: Never Used  Substance and Sexual Activity   Alcohol use: Yes    Comment: rare   Drug use: Never   Sexual activity: Not on file  Other Topics Concern   Not on file  Social History Narrative   Right handed    Caffeine  cup per day   Lives at home alone    Social Drivers of Health   Financial Resource Strain: Low Risk  (01/18/2024)   Overall Financial Resource Strain (CARDIA)    Difficulty of Paying Living Expenses: Not hard at all  Food Insecurity: No Food Insecurity (01/18/2024)   Hunger Vital Sign    Worried About Running Out of Food in the Last Year: Never true    Ran Out of Food in the Last Year: Never true  Transportation Needs: No Transportation Needs (01/18/2024)   PRAPARE - Administrator, Civil Service (Medical): No    Lack of Transportation (Non-Medical): No  Physical Activity: Sufficiently Active (01/18/2024)   Exercise Vital Sign  Days of Exercise per Week: 5 days    Minutes of Exercise per Session: 40 min  Stress: No Stress Concern Present (01/18/2024)   [employer] of Occupational Health - Occupational Stress Questionnaire    Feeling of Stress: Only a little  Social Connections: Moderately Isolated (01/18/2024)   Social Connection and Isolation Panel    Frequency of Communication with Friends and Family: More than three times a week    Frequency of Social Gatherings with Friends and Family: More than three times a week    Attends Religious Services: Never    Database administrator or Organizations: Yes    Attends Engineer, structural: More than 4 times per year    Marital Status: Widowed  Intimate Partner Violence: Not At Risk (01/10/2023)    Humiliation, Afraid, Rape, and Kick questionnaire    Fear of Current or Ex-Partner: No    Emotionally Abused: No    Physically Abused: No    Sexually Abused: No    Family History  Problem Relation Age of Onset   Cancer - Ovarian Mother    Arthritis Father    Alzheimer's disease Sister    Arthritis Sister      Review of Systems  Constitutional: Negative.  Negative for chills and fever.  HENT: Negative.  Negative for congestion and sore throat.   Respiratory: Negative.  Negative for cough and shortness of breath.   Cardiovascular: Negative.  Negative for chest pain and palpitations.  Gastrointestinal:  Negative for abdominal pain, diarrhea, nausea and vomiting.  Genitourinary: Negative.  Negative for dysuria and hematuria.  Musculoskeletal:  Positive for joint pain and myalgias.  Skin: Negative.  Negative for rash.  Neurological: Negative.  Negative for dizziness and headaches.  All other systems reviewed and are negative.   Vitals:   01/22/24 0850  BP: 126/78  Pulse: 81  Temp: 98.8 F (37.1 C)  SpO2: 98%    Physical Exam Vitals reviewed.  Constitutional:      Appearance: Normal appearance.   Eyes:     Extraocular Movements: Extraocular movements intact.    Cardiovascular:     Rate and Rhythm: Normal rate.  Pulmonary:     Effort: Pulmonary effort is normal.   Skin:    General: Skin is warm and dry.   Neurological:     Mental Status: She is alert and oriented to person, place, and time.   Psychiatric:        Behavior: Behavior normal.      ASSESSMENT & PLAN: A total of 40 minutes was spent with the patient and counseling/coordination of care regarding preparing for this visit, review of most recent office visit notes, review of multiple chronic medical conditions and their management, review of all medications, review of most recent bloodwork results, review of health maintenance items, education on nutrition, prognosis, documentation, and need for follow  up.  Problem List Items Addressed This Visit       Cardiovascular and Mediastinum   Peripheral artery disease (HCC)   Clinically stable. Continues daily baby aspirin No signs of severe peripheral circulatory insufficiency        Other   History of osteoporosis   Stable. On weekly Fosamax  70 mg.       Dyslipidemia - Primary   History of statin side effects in the past.  Reacted to atorvastatin . Recently prescribed WelChol  but has not started History of peripheral artery disease and TIA in the past Not taking any medication at present time.  Diet and nutrition discussed      Relevant Orders   Comprehensive metabolic panel with GFR (Completed)   Lipid panel (Completed)   Muscle soreness   Clinically stable.  No red flag signs or symptoms. Not taking any cholesterol medication at present time Not taking any medications associated with muscle joint soreness. Differential diagnosis discussed Normal recent CMP and CBC from 12/09/2023 Unremarkable physical examination. No concerns identified today.      Patient Instructions  Health Maintenance After Age 30 After age 67, you are at a higher risk for certain long-term diseases and infections as well as injuries from falls. Falls are a major cause of broken bones and head injuries in people who are older than age 14. Getting regular preventive care can help to keep you healthy and well. Preventive care includes getting regular testing and making lifestyle changes as recommended by your health care provider. Talk with your health care provider about: Which screenings and tests you should have. A screening is a test that checks for a disease when you have no symptoms. A diet and exercise plan that is right for you. What should I know about screenings and tests to prevent falls? Screening and testing are the best ways to find a health problem early. Early diagnosis and treatment give you the best chance of managing medical conditions  that are common after age 94. Certain conditions and lifestyle choices may make you more likely to have a fall. Your health care provider may recommend: Regular vision checks. Poor vision and conditions such as cataracts can make you more likely to have a fall. If you wear glasses, make sure to get your prescription updated if your vision changes. Medicine review. Work with your health care provider to regularly review all of the medicines you are taking, including over-the-counter medicines. Ask your health care provider about any side effects that may make you more likely to have a fall. Tell your health care provider if any medicines that you take make you feel dizzy or sleepy. Strength and balance checks. Your health care provider may recommend certain tests to check your strength and balance while standing, walking, or changing positions. Foot health exam. Foot pain and numbness, as well as not wearing proper footwear, can make you more likely to have a fall. Screenings, including: Osteoporosis screening. Osteoporosis is a condition that causes the bones to get weaker and break more easily. Blood pressure screening. Blood pressure changes and medicines to control blood pressure can make you feel dizzy. Depression screening. You may be more likely to have a fall if you have a fear of falling, feel depressed, or feel unable to do activities that you used to do. Alcohol use screening. Using too much alcohol can affect your balance and may make you more likely to have a fall. Follow these instructions at home: Lifestyle Do not drink alcohol if: Your health care provider tells you not to drink. If you drink alcohol: Limit how much you have to: 0-1 drink a day for women. 0-2 drinks a day for men. Know how much alcohol is in your drink. In the [place] one drink equals one 12 oz bottle of beer (355 mL), one 5 oz glass of wine (148 mL), or one 1 oz glass of hard liquor (44 mL). Do not use any products  that contain nicotine or tobacco. These products include cigarettes, chewing tobacco, and vaping devices, such as e-cigarettes. If you need help quitting, ask your health care provider. Activity  Follow a regular exercise program to stay fit. This will help you maintain your balance. Ask your health care provider what types of exercise are appropriate for you. If you need a cane or walker, use it as recommended by your health care provider. Wear supportive shoes that have nonskid soles. Safety  Remove any tripping hazards, such as rugs, cords, and clutter. Install safety equipment such as grab bars in bathrooms and safety rails on stairs. Keep rooms and walkways well-lit. General instructions Talk with your health care provider about your risks for falling. Tell your health care provider if: You fall. Be sure to tell your health care provider about all falls, even ones that seem minor. You feel dizzy, tiredness (fatigue), or off-balance. Take over-the-counter and prescription medicines only as told by your health care provider. These include supplements. Eat a healthy diet and maintain a healthy weight. A healthy diet includes low-fat dairy products, low-fat (lean) meats, and fiber from whole grains, beans, and lots of fruits and vegetables. Stay current with your vaccines. Schedule regular health, dental, and eye exams. Summary Having a healthy lifestyle and getting preventive care can help to protect your health and wellness after age 29. Screening and testing are the best way to find a health problem early and help you avoid having a fall. Early diagnosis and treatment give you the best chance for managing medical conditions that are more common for people who are older than age 52. Falls are a major cause of broken bones and head injuries in people who are older than age 49. Take precautions to prevent a fall at home. Work with your health care provider to learn what changes you can make  to improve your health and wellness and to prevent falls. This information is not intended to replace advice given to you by your health care provider. Make sure you discuss any questions you have with your health care provider. Document Revised: 12/12/2020 Document Reviewed: 12/12/2020 Elsevier Patient Education  2024 Elsevier Inc.       Maryagnes Small, MD [facility]

## 2024-01-22 NOTE — Patient Instructions (Signed)
 Health Maintenance After Age 85 After age 4, you are at a higher risk for certain long-term diseases and infections as well as injuries from falls. Falls are a major cause of broken bones and head injuries in people who are older than age 47. Getting regular preventive care can help to keep you healthy and well. Preventive care includes getting regular testing and making lifestyle changes as recommended by your health care provider. Talk with your health care provider about: Which screenings and tests you should have. A screening is a test that checks for a disease when you have no symptoms. A diet and exercise plan that is right for you. What should I know about screenings and tests to prevent falls? Screening and testing are the best ways to find a health problem early. Early diagnosis and treatment give you the best chance of managing medical conditions that are common after age 37. Certain conditions and lifestyle choices may make you more likely to have a fall. Your health care provider may recommend: Regular vision checks. Poor vision and conditions such as cataracts can make you more likely to have a fall. If you wear glasses, make sure to get your prescription updated if your vision changes. Medicine review. Work with your health care provider to regularly review all of the medicines you are taking, including over-the-counter medicines. Ask your health care provider about any side effects that may make you more likely to have a fall. Tell your health care provider if any medicines that you take make you feel dizzy or sleepy. Strength and balance checks. Your health care provider may recommend certain tests to check your strength and balance while standing, walking, or changing positions. Foot health exam. Foot pain and numbness, as well as not wearing proper footwear, can make you more likely to have a fall. Screenings, including: Osteoporosis screening. Osteoporosis is a condition that causes  the bones to get weaker and break more easily. Blood pressure screening. Blood pressure changes and medicines to control blood pressure can make you feel dizzy. Depression screening. You may be more likely to have a fall if you have a fear of falling, feel depressed, or feel unable to do activities that you used to do. Alcohol use screening. Using too much alcohol can affect your balance and may make you more likely to have a fall. Follow these instructions at home: Lifestyle Do not drink alcohol if: Your health care provider tells you not to drink. If you drink alcohol: Limit how much you have to: 0-1 drink a day for women. 0-2 drinks a day for men. Know how much alcohol is in your drink. In the [place] one drink equals one 12 oz bottle of beer (355 mL), one 5 oz glass of wine (148 mL), or one 1 oz glass of hard liquor (44 mL). Do not use any products that contain nicotine or tobacco. These products include cigarettes, chewing tobacco, and vaping devices, such as e-cigarettes. If you need help quitting, ask your health care provider. Activity  Follow a regular exercise program to stay fit. This will help you maintain your balance. Ask your health care provider what types of exercise are appropriate for you. If you need a cane or walker, use it as recommended by your health care provider. Wear supportive shoes that have nonskid soles. Safety  Remove any tripping hazards, such as rugs, cords, and clutter. Install safety equipment such as grab bars in bathrooms and safety rails on stairs. Keep rooms and walkways  well-lit. General instructions Talk with your health care provider about your risks for falling. Tell your health care provider if: You fall. Be sure to tell your health care provider about all falls, even ones that seem minor. You feel dizzy, tiredness (fatigue), or off-balance. Take over-the-counter and prescription medicines only as told by your health care provider. These include  supplements. Eat a healthy diet and maintain a healthy weight. A healthy diet includes low-fat dairy products, low-fat (lean) meats, and fiber from whole grains, beans, and lots of fruits and vegetables. Stay current with your vaccines. Schedule regular health, dental, and eye exams. Summary Having a healthy lifestyle and getting preventive care can help to protect your health and wellness after age 11. Screening and testing are the best way to find a health problem early and help you avoid having a fall. Early diagnosis and treatment give you the best chance for managing medical conditions that are more common for people who are older than age 28. Falls are a major cause of broken bones and head injuries in people who are older than age 48. Take precautions to prevent a fall at home. Work with your health care provider to learn what changes you can make to improve your health and wellness and to prevent falls. This information is not intended to replace advice given to you by your health care provider. Make sure you discuss any questions you have with your health care provider. Document Revised: 12/12/2020 Document Reviewed: 12/12/2020 Elsevier Patient Education  2024 [employer].

## 2024-01-27 ENCOUNTER — Encounter: Payer: Self-pay | Admitting: Neurology

## 2024-01-27 ENCOUNTER — Ambulatory Visit (INDEPENDENT_AMBULATORY_CARE_PROVIDER_SITE_OTHER): Admitting: Neurology

## 2024-01-27 VITALS — BP 150/87 | HR 79 | Ht 65.0 in | Wt 181.0 lb

## 2024-01-27 DIAGNOSIS — I739 Peripheral vascular disease, unspecified: Secondary | ICD-10-CM

## 2024-01-27 DIAGNOSIS — Z8673 Personal history of transient ischemic attack (TIA), and cerebral infarction without residual deficits: Secondary | ICD-10-CM | POA: Diagnosis not present

## 2024-01-27 DIAGNOSIS — G629 Polyneuropathy, unspecified: Secondary | ICD-10-CM | POA: Diagnosis not present

## 2024-01-27 DIAGNOSIS — E785 Hyperlipidemia, unspecified: Secondary | ICD-10-CM | POA: Diagnosis not present

## 2024-01-27 DIAGNOSIS — I6521 Occlusion and stenosis of right carotid artery: Secondary | ICD-10-CM | POA: Diagnosis not present

## 2024-01-27 NOTE — Progress Notes (Signed)
 [facility]  PATIENT: Nancy Duncan DOB: August 21, 1940  REFERRING DOCTOR OR PCP: Donnice Hughes MD  SOURCE: Patient, notes from primary care  _________________________________   HISTORICAL  CHIEF COMPLAINT:  Chief Complaint  Patient presents with   RM10/POLYNEUROPATHY    Pt is here Alone. Pt states that her she lives with her polyneuropathy. Pt states that she still has numbness in her legs.    HISTORY OF PRESENT ILLNESS:  Nancy Duncan is a 83 y.o. woman woth vertigo and a burning sensation in her feet.      UPDATE 01/27/2024 She has hyperlipidemia and was on a Lipitor but had mylagia  she so stopped.  She has occluded right ICA.  A second statin, rosuvastatin  was tried (caused myalgia) and then Welchol  (stopped due to elevated Trig).    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  She had amaurosis fugax on the right leading to additional tests.       Rosuvastatin  caused myalgias.   Welchol  spiked her triglycerides so she was d/c.   Vertigo has mostly resolved now   She still has tight dysesthesias in feet.   She has had a vascular evaluation which was fine.     She has a dry mouth but not dry eyes.    She has no rashes .   Labs were fine (ssa/ssb, SPEP/IEF, RF,, copper , B12, Anti Hu/Yo)).   HgbA1c borderline at Symptoms started many years ago and she has taken vitamins.   Symptoms worsened more recently   She denies foot weakness  We tried alpha lipoic acid and it initially helped but not long term.    She still tales now and then.    She has been diagnosed with a pancreatic pre-cancer and is being referred to [facility]   A recent PET scan was fine.    She denies neck pain.  No urinary urgency or changes.    She denies numbness.    No neck, arm or axillary.      I personally reviewed the MRI of the brain from 06/08/2021.  It shows minimal age appropriate generalized cortical atrophy and mild chronic microvascular ischemic changes in the pons and  hemispheres.  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  The arm weakness from the PION has improved to baseline.     LABS 08/2022:  Hgb A1c was 5.7, SPEPIEF, B12, antiHu/Yo, Copper , RF were neg or normal.    REVIEW OF SYSTEMS: Constitutional: No fevers, chills, sweats, or change in appetite Eyes: No visual changes, double vision, eye pain Ear, nose and throat: No hearing loss, ear pain, nasal congestion, sore throat Cardiovascular: No chest pain, palpitations Respiratory:  No shortness of breath at rest or with exertion.   No wheezes GastrointestinaI: No nausea, vomiting, diarrhea, abdominal pain, fecal incontinence Genitourinary:  No dysuria, urinary retention or frequency.  No nocturia. Musculoskeletal:  No neck pain, back pain Integumentary: No rash, pruritus, skin lesions Neurological: as above Psychiatric: No depression at this time.  No anxiety Endocrine: No palpitations, diaphoresis, change in appetite, change in weigh or increased thirst Hematologic/Lymphatic:  No anemia, purpura, petechiae. Allergic/Immunologic: No itchy/runny eyes, nasal congestion, recent allergic reactions, rashes  ALLERGIES: Allergies  Allergen Reactions   Erythromycin Swelling   Lamotrigine      Muscle soreness    Lyrica  [Pregabalin ]     Muscle soreness   Statins Other (See Comments)    Sore muscles     HOME MEDICATIONS:  Current  Outpatient Medications:    alendronate  (FOSAMAX ) 70 MG tablet, TAKE 1 TABLET BY MOUTH ONCE A WEEK, Disp: 84 tablet, Rfl: 1   ASPIRIN 81 PO, Take by mouth., Disp: , Rfl:    citalopram  (CELEXA ) 10 MG tablet, TAKE 1 TABLET BY MOUTH DAILY, Disp: 90 tablet, Rfl: 2   meclizine  (ANTIVERT ) 12.5 MG tablet, Take 1 tablet (12.5 mg total) by mouth 3 (three) times daily as needed for dizziness., Disp: 30 tablet, Rfl: 1  PAST MEDICAL HISTORY: Past Medical History:  Diagnosis Date   Breast cancer (HCC)    Cancer (HCC)    Breast Cancer   Pancreatic tumor    Personal  history of radiation therapy     PAST SURGICAL HISTORY: Past Surgical History:  Procedure Laterality Date   APPENDECTOMY     BREAST LUMPECTOMY     SPLENECTOMY, TOTAL      FAMILY HISTORY: Family History  Problem Relation Age of Onset   Cancer - Ovarian Mother    Arthritis Father    Alzheimer's disease Sister    Arthritis Sister     SOCIAL HISTORY:  Social History   Socioeconomic History   Marital status: Widowed    Spouse name: Not on file   Number of children: 3   Years of education: Not on file   Highest education level: Master's degree (e.g., MA, MS, MEng, MEd, MSW, MBA)  Occupational History   Not on file  Tobacco Use   Smoking status: Former    Types: Cigarettes   Smokeless tobacco: Never  Vaping Use   Vaping status: Never Used  Substance and Sexual Activity   Alcohol use: Yes    Comment: rare   Drug use: Never   Sexual activity: Not on file  Other Topics Concern   Not on file  Social History Narrative   Right handed    Caffeine  cup per day   Lives at home alone    Social Drivers of Health   Financial Resource Strain: Low Risk  (01/18/2024)   Overall Financial Resource Strain (CARDIA)    Difficulty of Paying Living Expenses: Not hard at all  Food Insecurity: No Food Insecurity (01/18/2024)   Hunger Vital Sign    Worried About Running Out of Food in the Last Year: Never true    Ran Out of Food in the Last Year: Never true  Transportation Needs: No Transportation Needs (01/18/2024)   PRAPARE - Administrator, Civil Service (Medical): No    Lack of Transportation (Non-Medical): No  Physical Activity: Sufficiently Active (01/18/2024)   Exercise Vital Sign    Days of Exercise per Week: 5 days    Minutes of Exercise per Session: 40 min  Stress: No Stress Concern Present (01/18/2024)   [employer] of Occupational Health - Occupational Stress Questionnaire    Feeling of Stress: Only a little  Social Connections: Moderately Isolated  (01/18/2024)   Social Connection and Isolation Panel    Frequency of Communication with Friends and Family: More than three times a week    Frequency of Social Gatherings with Friends and Family: More than three times a week    Attends Religious Services: Never    Database administrator or Organizations: Yes    Attends Engineer, structural: More than 4 times per year    Marital Status: Widowed  Intimate Partner Violence: Not At Risk (01/10/2023)   Humiliation, Afraid, Rape, and Kick questionnaire    Fear of Current or  Ex-Partner: No    Emotionally Abused: No    Physically Abused: No    Sexually Abused: No     PHYSICAL EXAM  Vitals:   01/27/24 0824  BP: (!) 150/87  Pulse: 79  SpO2: 96%  Weight: 181 lb (82.1 kg)  Height: 5' 5 (1.651 m)    Body mass index is 30.12 kg/m.   General: The patient is well-developed and well-nourished and in no acute distress.   No carotid bruit.   Heart is RRR, normal s1, S2 and no M.    HEENT:  Head is [place]/AT.  Sclera are anicteric.    Skin: Extremities are without rash or  edema.  Musculoskeletal:  Back is nontender  Neurologic Exam  Mental status: The patient is alert and oriented x 3 at the time of the examination. The patient has apparent normal recent and remote memory, with an apparently normal attention span and concentration ability.   Speech is normal.  Cranial nerves: Extraocular movements are full.  Facial strength and sensation was normal.  Hearing was slightly asymmetric.  Motor:  Muscle bulk is normal.   Tone is normal. Strength is  5 / 5 in the right hand/arm and both legs.  Strength was 5/5 in all 4 limbs now.    Sensory: Sensory testing is intact to pinprick, soft touch and vibration sensation in the arms.  In the legs, vibration sensation was normal at the knees, reduced at the toes.  Pinprick sensation was normal in the feet and legs.  Coordination: Cerebellar testing reveals good finger-nose-finger and heel-to-shin  bilaterally.  Gait and station: Station is normal.   Gait is normal. Tandem gait is mildly wide but probably normal for age. Romberg is negative.   Reflexes: Deep tendon reflexes are symmetric and normal in the arms and absent in the legs.        DIAGNOSTIC DATA (LABS, IMAGING, TESTING) - I reviewed patient records, labs, notes, testing and imaging myself where available.  Lab Results  Component Value Date   WBC 7.0 07/24/2023   HGB 13.7 07/24/2023   HCT 40.6 07/24/2023   MCV 97.8 07/24/2023   PLT 289.0 07/24/2023      Component Value Date/Time   NA 136 01/22/2024 0943   K 4.4 01/22/2024 0943   CL 103 01/22/2024 0943   CO2 26 01/22/2024 0943   GLUCOSE 95 01/22/2024 0943   BUN 13 01/22/2024 0943   CREATININE 0.72 01/22/2024 0943   CALCIUM  9.1 01/22/2024 0943   PROT 7.2 01/22/2024 0943   PROT 6.9 08/23/2022 0927   ALBUMIN 4.2 01/22/2024 0943   AST 17 01/22/2024 0943   ALT 13 01/22/2024 0943   ALKPHOS 79 01/22/2024 0943   BILITOT 0.5 01/22/2024 0943   GFRNONAA 48 (L) 06/08/2021 1435        ASSESSMENT AND PLAN  Peripheral artery disease (HCC) - Plan: US  Carotid Bilateral  Right carotid artery occlusion - Plan: US  Carotid Bilateral  History of TIA (transient ischemic attack)  Dyslipidemia  Polyneuropathy  Dysesthesias are mild and she will stay off treatment.  Lyrica  and gabapentin  caused myalgias. If neuropathic symptoms worsen, will check NCV/EMG She has ICA occlusion and elevated lipids.   Could not tolerate a statin or Welchol .  Continue ASA   We will check U/S for left progression.  I Rtc 12 months.   This visit is part of a comprehensive longitudinal care medical relationship regarding the patients primary diagnosis of polyneuropathy and related concerns.   Nancy Duncan  RONAL Crete, MD, Teola RENO 01/27/2024, 10:01 AM Certified in Neurology, Clinical Neurophysiology, Sleep Medicine and Neuroimaging  [facility] [address] [place], [place] [address] [phone]

## 2024-01-29 ENCOUNTER — Ambulatory Visit
Admission: RE | Admit: 2024-01-29 | Discharge: 2024-01-29 | Disposition: A | Discharge status: Home / Self Care | Source: Ambulatory Visit | Attending: Neurology | Admitting: Neurology

## 2024-01-29 DIAGNOSIS — I739 Peripheral vascular disease, unspecified: Secondary | ICD-10-CM

## 2024-01-29 DIAGNOSIS — I6521 Occlusion and stenosis of right carotid artery: Secondary | ICD-10-CM | POA: Diagnosis not present

## 2024-01-30 ENCOUNTER — Ambulatory Visit: Payer: Self-pay | Admitting: Neurology

## 2024-01-30 ENCOUNTER — Telehealth: Payer: Self-pay | Admitting: *Deleted

## 2024-01-30 NOTE — Progress Notes (Signed)
 Care Guide Pharmacy Note  01/30/2024 Name: Tanyla Stege MRN: [id number] DOB: [birth year]  Referred By: Purcell Emil Schanz, MD Reason for referral: Complex Care Management (Outreach to schedule referral with pharmacist ) and Call Attempt #1   Sonika Levins is a 83 y.o. year old female who is a primary care patient of Sagardia, Emil Schanz, MD.  Sarabella Caprio was referred to the pharmacist for assistance related to: statin intolerance   An unsuccessful telephone outreach was attempted today to contact the patient who was referred to the pharmacy team for assistance with medication management. Additional attempts will be made to contact the patient.  Thedford Franks, CMA [place]  [facility], [facility] Guide Direct Dial: [phone]  Fax: [phone] Website: [email or web address]

## 2024-01-31 NOTE — Progress Notes (Unsigned)
 Care Guide Pharmacy Note  01/31/2024 Name: [name] MRN: [id number] DOB: 08/02/1941  Referred By: Purcell Emil Schanz, MD Reason for referral: Complex Care Management (Outreach to schedule referral with pharmacist ) and Call Attempt #1   Nancy Duncan is a 83 y.o. year old female who is a primary care patient of Sagardia, Emil Schanz, MD.  Labrittany Wechter was referred to the pharmacist for assistance related to: statin intolerance   A second unsuccessful telephone outreach was attempted today to contact the patient who was referred to the pharmacy team for assistance with medication management. Additional attempts will be made to contact the patient.  Thedford Franks, CMA [place]  [facility], [facility] Guide Direct Dial: [phone]  Fax: [phone] Website: [email or web address]

## 2024-02-03 NOTE — Progress Notes (Signed)
 Care Guide Pharmacy Note  02/03/2024 Name: Nancy Duncan MRN: [id number] DOB: Jul 06, 1941  Referred By: Purcell Emil Schanz, MD Reason for referral: Complex Care Management (Outreach to schedule referral with pharmacist ) and Call Attempt #1   Nancy Duncan is a 83 y.o. year old female who is a primary care patient of Sagardia, Emil Schanz, MD.  Nancy Duncan was referred to the pharmacist for assistance related to: statin intolerance  A third unsuccessful telephone outreach was attempted today to contact the patient who was referred to the pharmacy team for assistance with medication management. The Population Health team is pleased to engage with this patient at any time in the future upon receipt of referral and should he/she be interested in assistance from the Population Health team.  Thedford Franks, CMA [facility]  [facility], [facility] Guide Direct Dial: [phone]  Fax: [phone] Website: [email or web address]

## 2024-02-10 NOTE — Progress Notes (Signed)
 Care Guide Pharmacy Note  02/10/2024 Name: Nancy Duncan MRN: [id number] DOB: 12-29-1940  Referred By: Purcell Emil Schanz, MD Reason for referral: Complex Care Management (Outreach to schedule referral with pharmacist ) and Call Attempt #1   Nancy Duncan is a 83 y.o. year old female who is a primary care patient of Sagardia, Miguel Jose, MD.  Nancy Duncan was referred to the pharmacist for assistance related to: statin intolerance   Successful contact was made with the patient to discuss pharmacy services including being ready for the pharmacist to call at least 5 minutes before the scheduled appointment time and to have medication bottles and any blood pressure readings ready for review. The patient agreed to meet with the pharmacist via telephone visit on 02/20/2024  Thedford Franks, CMA [place]  [facility], [facility] Guide Direct Dial: [phone]  Fax: [phone] Website: [email or web address]

## 2024-02-13 ENCOUNTER — Other Ambulatory Visit

## 2024-02-13 DIAGNOSIS — Z006 Encounter for examination for normal comparison and control in clinical research program: Secondary | ICD-10-CM

## 2024-02-18 ENCOUNTER — Ambulatory Visit (INDEPENDENT_AMBULATORY_CARE_PROVIDER_SITE_OTHER)

## 2024-02-18 ENCOUNTER — Encounter: Payer: Self-pay | Admitting: Emergency Medicine

## 2024-02-18 VITALS — Ht 65.0 in | Wt 181.0 lb

## 2024-02-18 DIAGNOSIS — Z0000 Encounter for general adult medical examination without abnormal findings: Secondary | ICD-10-CM

## 2024-02-18 NOTE — Patient Instructions (Signed)
 Nancy Duncan , Thank you for taking time out of your busy schedule to complete your Annual Wellness Visit with me. I enjoyed our conversation and look forward to speaking with you again next year. I, as well as your care team,  appreciate your ongoing commitment to your health goals. Please review the following plan we discussed and let me know if I can assist you in the future. Your Game plan/ To Do List   Follow up Visits: Next Medicare AWV with our clinical staff: 02/18/2025.   Have you seen your provider in the last 6 months (3 months if uncontrolled diabetes)? Yes Next Office Visit with your provider: 07/23/2024.  Clinician Recommendations:  Aim for 30 minutes of exercise or brisk walking, 6-8 glasses of water, and 5 servings of fruits and vegetables each day. Keep up the good work.      This is a list of the screening recommended for you and due dates:  Health Maintenance  Topic Date Due   DEXA scan (bone density measurement)  Never done   COVID-19 Vaccine (2 - Mixed Product risk series) 04/08/2023   Flu Shot  03/06/2024   Medicare Annual Wellness Visit  02/17/2025   DTaP/Tdap/Td vaccine (2 - Td or Tdap) 01/16/2032   Pneumococcal Vaccine for age over 87  Completed   Zoster (Shingles) Vaccine  Completed   Hepatitis B Vaccine  Aged Out   HPV Vaccine  Aged Out   Meningitis B Vaccine  Aged Out    Advanced directives: (Declined) Advance directive discussed with you today. Even though you declined this today, please call our office should you change your mind, and we can give you the proper paperwork for you to fill out. Advance Care Planning is important because it:  [x]  Makes sure you receive the medical care that is consistent with your values, goals, and preferences  [x]  It provides guidance to your family and loved ones and reduces their decisional burden about whether or not they are making the right decisions based on your wishes.  Follow the link provided in your after visit  summary or read over the paperwork we have mailed to you to help you started getting your Advance Directives in place. If you need assistance in completing these, please reach out to us  so that we can help you!  See attachments for Preventive Care and Fall Prevention Tips.

## 2024-02-18 NOTE — Progress Notes (Signed)
 Subjective:   Nancy Duncan is a 83 y.o. who presents for a Medicare Wellness preventive visit.  As a reminder, Annual Wellness Visits don't include a physical exam, and some assessments may be limited, especially if this visit is performed virtually. We may recommend an in-person follow-up visit with your provider if needed.  Visit Complete: Virtual I connected with  Nancy Duncan on 02/18/24 by a audio enabled telemedicine application and verified that I am speaking with the correct person using two identifiers.  Patient Location: Home  Provider Location: Home Office  I discussed the limitations of evaluation and management by telemedicine. The patient expressed understanding and agreed to proceed.  Vital Signs: Because this visit was a virtual/telehealth visit, some criteria may be missing or patient reported. Any vitals not documented were not able to be obtained and vitals that have been documented are patient reported.  VideoDeclined- This patient declined Librarian, academic. Therefore the visit was completed with audio only.  Persons Participating in Visit: Patient.  AWV Questionnaire: Yes: Patient Medicare AWV questionnaire was completed by the patient on 02/14/2024; I have confirmed that all information answered by patient is correct and no changes since this date.  Cardiac Risk Factors include: advanced age (>35men, >14 women);Other (see comment);dyslipidemia, Risk factor comments: TIA, PAD     Objective:    Today's Vitals   02/18/24 1348  Weight: 181 lb (82.1 kg)  Height: 5' 5 (1.651 m)   Body mass index is 30.12 kg/m.     02/18/2024    1:53 PM 01/10/2023    2:12 PM 01/12/2022    1:06 PM  Advanced Directives  Does Patient Have a Medical Advance Directive? No Yes Yes  Type of Special educational needs teacher of [place];Living will Living will;Healthcare Power of Attorney  Does patient want to make changes to medical advance directive?   No  - Patient declined  Copy of Healthcare Power of Attorney in Chart?  No - copy requested No - copy requested    Current Medications (verified) Outpatient Encounter Medications as of 02/18/2024  Medication Sig   alendronate  (FOSAMAX ) 70 MG tablet TAKE 1 TABLET BY MOUTH ONCE A WEEK   ASPIRIN 81 PO Take by mouth.   citalopram  (CELEXA ) 10 MG tablet TAKE 1 TABLET BY MOUTH DAILY   meclizine  (ANTIVERT ) 12.5 MG tablet Take 1 tablet (12.5 mg total) by mouth 3 (three) times daily as needed for dizziness.   No facility-administered encounter medications on file as of 02/18/2024.    Allergies (verified) Erythromycin, Lamotrigine , Lyrica  [pregabalin ], and Statins   History: Past Medical History:  Diagnosis Date   Breast cancer (HCC)    Cancer (HCC)    Breast Cancer   Pancreatic tumor    Personal history of radiation therapy    Past Surgical History:  Procedure Laterality Date   APPENDECTOMY     BREAST LUMPECTOMY     SPLENECTOMY, TOTAL     Family History  Problem Relation Age of Onset   Cancer - Ovarian Mother    Arthritis Father    Alzheimer's disease Sister    Arthritis Sister    Social History   Socioeconomic History   Marital status: Widowed    Spouse name: Not on file   Number of children: 3   Years of education: Not on file   Highest education level: Master's degree (e.g., MA, MS, MEng, MEd, MSW, MBA)  Occupational History   Occupation: RETIRED  Tobacco Use   Smoking  status: Former    Types: Cigarettes   Smokeless tobacco: Never  Vaping Use   Vaping status: Never Used  Substance and Sexual Activity   Alcohol use: Yes    Comment: rare   Drug use: Never   Sexual activity: Not on file  Other Topics Concern   Not on file  Social History Narrative   Right handed    Caffeine  cup per day   Lives at home alone /2025   Social Drivers of Health   Financial Resource Strain: Low Risk  (02/18/2024)   Overall Financial Resource Strain (CARDIA)    Difficulty of Paying  Living Expenses: Not hard at all  Food Insecurity: No Food Insecurity (02/18/2024)   Hunger Vital Sign    Worried About Running Out of Food in the Last Year: Never true    Ran Out of Food in the Last Year: Never true  Transportation Needs: No Transportation Needs (02/18/2024)   PRAPARE - Administrator, Civil Service (Medical): No    Lack of Transportation (Non-Medical): No  Physical Activity: Sufficiently Active (02/18/2024)   Exercise Vital Sign    Days of Exercise per Week: 6 days    Minutes of Exercise per Session: 30 min  Stress: No Stress Concern Present (02/18/2024)   [employer] of Occupational Health - Occupational Stress Questionnaire    Feeling of Stress: Not at all  Social Connections: Moderately Isolated (02/18/2024)   Social Connection and Isolation Panel    Frequency of Communication with Friends and Family: Twice a week    Frequency of Social Gatherings with Friends and Family: More than three times a week    Attends Religious Services: Never    Database administrator or Organizations: Yes    Attends Banker Meetings: Never    Marital Status: Widowed    Tobacco Counseling Counseling given: Not Answered    Clinical Intake:  Pre-visit preparation completed: Yes  Pain : No/denies pain     BMI - recorded: 30.12 Nutritional Status: BMI > 30  Obese Nutritional Risks: None Diabetes: No  Lab Results  Component Value Date   HGBA1C 5.6 07/24/2023   HGBA1C 5.7 01/23/2023   HGBA1C 5.7 (H) 08/23/2022     How often do you need to have someone help you when you read instructions, pamphlets, or other written materials from your doctor or pharmacy?: 1 - Never  Interpreter Needed?: No  Information entered by :: Yetta Marceaux, RMA   Activities of Daily Living     02/14/2024    4:21 PM  In your present state of health, do you have any difficulty performing the following activities:  Hearing? 0  Vision? 0  Difficulty concentrating or  making decisions? 0  Walking or climbing stairs? 0  Dressing or bathing? 0  Doing errands, shopping? 0  Preparing Food and eating ? N  Using the Toilet? N  In the past six months, have you accidently leaked urine? Y  Do you have problems with loss of bowel control? N  Managing your Medications? N  Managing your Finances? N  Housekeeping or managing your Housekeeping? N    Patient Care Team: Purcell Emil Schanz, MD as PCP - General (Internal Medicine)  I have updated your Care Teams any recent Medical Services you may have received from other providers in the past year.     Assessment:   This is a routine wellness examination for Nancy Duncan.  Hearing/Vision screen Hearing Screening - Comments::  Denies hearing difficulties   Vision Screening - Comments:: Denies vision issues. [facility]    Goals Addressed   None    Depression Screen     02/18/2024    1:58 PM 01/22/2024    8:54 AM 12/23/2023    1:07 PM 07/24/2023    8:56 AM 07/03/2023    9:39 AM 05/16/2023    4:45 PM 01/23/2023    9:56 AM  PHQ 2/9 Scores  PHQ - 2 Score 0 0 0 0 0 0 0  PHQ- 9 Score 1          Fall Risk     02/14/2024    4:21 PM 01/22/2024    8:54 AM 12/23/2023    1:07 PM 07/24/2023    8:55 AM 07/03/2023    9:38 AM  Fall Risk   Falls in the past year? 0 1 0 0 0  Number falls in past yr: 0 0 1 0 0  Injury with Fall? 0 0 0 0 0  Risk for fall due to :  No Fall Risks No Fall Risks No Fall Risks No Fall Risks  Follow up Falls evaluation completed;Falls prevention discussed Falls evaluation completed Falls evaluation completed Falls evaluation completed Falls evaluation completed    MEDICARE RISK AT HOME:  Medicare Risk at Home Any stairs in or around the home?: (Patient-Rptd) Yes If so, are there any without handrails?: (Patient-Rptd) Yes Home free of loose throw rugs in walkways, pet beds, electrical cords, etc?: (Patient-Rptd) No Adequate lighting in your home to reduce risk of falls?:  (Patient-Rptd) Yes Life alert?: (Patient-Rptd) No Use of a cane, walker or w/c?: (Patient-Rptd) No Grab bars in the bathroom?: (Patient-Rptd) Yes Shower chair or bench in shower?: (Patient-Rptd) No Elevated toilet seat or a handicapped toilet?: (Patient-Rptd) No  TIMED UP AND GO:  Was the test performed?  No  Cognitive Function: Declined/Normal: No cognitive concerns noted by patient or family. Patient alert, oriented, able to answer questions appropriately and recall recent events. No signs of memory loss or confusion.        01/10/2023    2:03 PM 01/12/2022    1:23 PM  6CIT Screen  What Year? 0 points 0 points  What month? 0 points 0 points  What time? 0 points 0 points  Count back from 20 0 points 0 points  Months in reverse 0 points 0 points  Repeat phrase 4 points 0 points  Total Score 4 points 0 points    Immunizations Immunization History  Administered Date(s) Administered   Fluad Quad(high Dose 65+) 04/08/2022   Influenza-Unspecified 04/25/2021, 03/15/2023   PNEUMOCOCCAL CONJUGATE-20 08/02/2021   Tdap 01/15/2022   Unspecified SARS-COV-2 Vaccination 03/11/2023   Zoster Recombinant(Shingrix) 11/24/2021, 05/21/2022    Screening Tests Health Maintenance  Topic Date Due   DEXA SCAN  Never done   COVID-19 Vaccine (2 - Mixed Product risk series) 04/08/2023   INFLUENZA VACCINE  03/06/2024   Medicare Annual Wellness (AWV)  02/17/2025   DTaP/Tdap/Td (2 - Td or Tdap) 01/16/2032   Pneumococcal Vaccine: 50+ Years  Completed   Zoster Vaccines- Shingrix  Completed   Hepatitis B Vaccines  Aged Out   HPV VACCINES  Aged Out   Meningococcal B Vaccine  Aged Out    Health Maintenance  Health Maintenance Due  Topic Date Due   DEXA SCAN  Never done   COVID-19 Vaccine (2 - Mixed Product risk series) 04/08/2023   Health Maintenance Items Addressed: DEXA scheduled  Additional Screening:  Vision Screening: Recommended annual ophthalmology exams for early detection of  glaucoma and other disorders of the eye. Would you like a referral to an eye doctor? No    Dental Screening: Recommended annual dental exams for proper oral hygiene  Community Resource Referral / Chronic Care Management: CRR required this visit?  No   CCM required this visit?  No   Plan:    I have personally reviewed and noted the following in the patient's chart:   Medical and social history Use of alcohol, tobacco or illicit drugs  Current medications and supplements including opioid prescriptions. Patient is not currently taking opioid prescriptions. Functional ability and status Nutritional status Physical activity Advanced directives List of other physicians Hospitalizations, surgeries, and ER visits in previous 12 months Vitals Screenings to include cognitive, depression, and falls Referrals and appointments  In addition, I have reviewed and discussed with patient certain preventive protocols, quality metrics, and best practice recommendations. A written personalized care plan for preventive services as well as general preventive health recommendations were provided to patient.   Yosgart Pavey L Lazar Tierce, CMA   02/18/2024   After Visit Summary: (MyChart) Due to this being a telephonic visit, the after visit summary with patients personalized plan was offered to patient via MyChart   Notes: Nothing significant to report at this time.

## 2024-02-20 ENCOUNTER — Other Ambulatory Visit: Admitting: Pharmacist

## 2024-02-20 DIAGNOSIS — E785 Hyperlipidemia, unspecified: Secondary | ICD-10-CM

## 2024-02-20 MED ORDER — ROSUVASTATIN CALCIUM 5 MG PO TABS: 5.0000 mg | ORAL_TABLET | ORAL | 1 refills | Status: DC

## 2024-02-20 NOTE — Patient Instructions (Signed)
 It was a pleasure speaking with you today!  Start rosuvastatin  5 mg three times weekly. I will call in 4 weeks to see how you are tolerating it.  Feel free to call with any questions or concerns!  Darrelyn Drum, PharmD, BCPS, CPP Clinical Pharmacist Practitioner [facility] at [facility] [phone]

## 2024-02-20 NOTE — Progress Notes (Signed)
   02/20/2024 Name: Nancy Duncan MRN: [id number] DOB: 1941/02/24  Chief Complaint  Patient presents with   Hyperlipidemia   Medication Management    Nancy Duncan is a 83 y.o. year old female who presented for a telephone visit.   They were referred to the pharmacist by their PCP for assistance in managing hyperlipidemia.   Subjective:  Care Team: Primary Care Provider: Purcell Emil Schanz, MD ; Next Scheduled Visit: 07/23/24  Medication Access/Adherence  Current Pharmacy:  [id number] [place], [place] - [address] AT [facility] OF [address] & [facility] [address] [place] [address] Phone: [phone] Fax: [phone]   Patient reports affordability concerns with their medications: No  Patient reports access/transportation concerns to their pharmacy: No  Patient reports adherence concerns with their medications:  No     Hyperlipidemia/ASCVD Risk Reduction  Current lipid lowering medications: none Medications tried in the past: atorvastatin  20 mg (myalgias), rosuvastatin  10 mg (myalgias), Welchol  (increased TG)  Antiplatelet regimen: aspirin 81 mg daily  ASCVD History: PAD, hx TIA  Current physical activity: has personal trainer for last 6 months, fast walking x 30 minutes days she doesn't go to the gym. Tries to exercise daily.  Diet: has been watching her diet, low sugar, low fat. Has lost 3 lbs.   Objective:  Lab Results  Component Value Date   HGBA1C 5.6 07/24/2023    Lab Results  Component Value Date   CREATININE 0.72 01/22/2024   BUN 13 01/22/2024   NA 136 01/22/2024   K 4.4 01/22/2024   CL 103 01/22/2024   CO2 26 01/22/2024    Lab Results  Component Value Date   CHOL 229 (H) 01/22/2024   HDL 68.40 01/22/2024   LDLCALC 114 (H) 01/22/2024   TRIG 233.0 (H) 01/22/2024   CHOLHDL 3 01/22/2024    Medications Reviewed Today     Reviewed by [name], RPH (Pharmacist) on 02/20/24 at 1507  Med List Status:  <None>   Medication Order Taking? Sig Documenting Provider Last Dose Status Informant  alendronate  (FOSAMAX ) 70 MG tablet [id number] Yes TAKE 1 TABLET BY MOUTH ONCE A WEEK Purcell Emil Schanz, MD  Active   ASPIRIN 81 PO [id number] Yes Take by mouth. [provider]  Active   citalopram  (CELEXA ) 10 MG tablet [id number] Yes TAKE 1 TABLET BY MOUTH DAILY Sagardia, Emil Schanz, MD  Active   meclizine  (ANTIVERT ) 12.5 MG tablet [id number] Yes Take 1 tablet (12.5 mg total) by mouth 3 (three) times daily as needed for dizziness. Sagardia, Miguel Jose, MD  Active            Med Note CARMIN, NELDA KANDICE [place] Jan 23, 2023  9:56 AM) As needed              Assessment/Plan:   Hyperlipidemia/ASCVD Risk Reduction: - Currently uncontrolled. LDL goal <70, TG goal <150 due to hx ASCVD - Reviewed long term complications of uncontrolled cholesterol - Continue lifestyle/dietary modifications - Recommend to trial rosuvastatin  5 mg three times weekly  - If unable to tolerate, can try ezetimibe   Follow Up Plan: 8/14  [name], PharmD, BCPS, CPP Clinical Pharmacist Practitioner [facility] at [facility] [phone]

## 2024-02-22 LAB — GENECONNECT MOLECULAR SCREEN: Genetic Analysis Overall Interpretation: NEGATIVE

## 2024-03-04 DIAGNOSIS — H401132 Primary open-angle glaucoma, bilateral, moderate stage: Secondary | ICD-10-CM | POA: Diagnosis not present

## 2024-03-04 DIAGNOSIS — H353132 Nonexudative age-related macular degeneration, bilateral, intermediate dry stage: Secondary | ICD-10-CM | POA: Diagnosis not present

## 2024-03-12 ENCOUNTER — Other Ambulatory Visit (HOSPITAL_COMMUNITY): Payer: Self-pay

## 2024-03-16 DIAGNOSIS — K432 Incisional hernia without obstruction or gangrene: Secondary | ICD-10-CM | POA: Diagnosis not present

## 2024-03-19 ENCOUNTER — Other Ambulatory Visit: Admitting: Pharmacist

## 2024-03-19 DIAGNOSIS — E785 Hyperlipidemia, unspecified: Secondary | ICD-10-CM

## 2024-03-19 MED ORDER — EZETIMIBE 10 MG PO TABS: 10.0000 mg | ORAL_TABLET | Freq: Every day | ORAL | 1 refills | Status: DC

## 2024-03-19 NOTE — Progress Notes (Deleted)
   03/19/2024 Name: Nancy Duncan MRN: [id number] DOB: [birth year]  Chief Complaint  Patient presents with   Hyperlipidemia   Medication Management    Nancy Duncan is a 83 y.o. year old female who presented for a telephone visit.   They were referred to the pharmacist by their PCP for assistance in managing hyperlipidemia.    Subjective:  Care Team: Primary Care Provider: Purcell Emil Schanz, MD ; Next Scheduled Visit: 07/23/24  Medication Access/Adherence  Current Pharmacy:  [id number] [place], [place] - [address] AT [facility] OF [address] & [facility] [address] [place] [address] Phone: [phone] Fax: [phone]   Patient reports affordability concerns with their medications: No  Patient reports access/transportation concerns to their pharmacy: No  Patient reports adherence concerns with their medications:  No     Hyperlipidemia/ASCVD Risk Reduction  Current lipid lowering medications: **Tried rosuvastatin 5 mg three times weekly for about 2 weeks. Stopped 5 days ago due to muscle pain. Medications tried in the past: atorvastatin 20 mg (myalgias), rosuvastatin 10 mg (myalgias), Welchol (increased TG)  Antiplatelet regimen: aspirin 81 mg daily  ASCVD History: PAD, hx TIA  Current physical activity: has personal trainer for last 6 months, fast walking x 30 minutes days she doesn't go to the gym. Tries to exercise daily.  Diet: has been watching her diet, low sugar, low fat. Has lost 3 lbs.   Objective:  Lab Results  Component Value Date   HGBA1C 5.6 07/24/2023    Lab Results  Component Value Date   CREATININE 0.72 01/22/2024   BUN 13 01/22/2024   NA 136 01/22/2024   K 4.4 01/22/2024   CL 103 01/22/2024   CO2 26 01/22/2024    Lab Results  Component Value Date   CHOL 229 (H) 01/22/2024   HDL 68.40 01/22/2024   LDLCALC 114 (H) 01/22/2024   TRIG 233.0 (H) 01/22/2024   CHOLHDL 3 01/22/2024    Medications Reviewed  Today   Medications were not reviewed in this encounter       Assessment/Plan:   Hyperlipidemia/ASCVD Risk Reduction: - Currently uncontrolled. LDL goal <70, TG goal <150 due to hx ASCVD - Reviewed long term complications of uncontrolled cholesterol - Continue lifestyle/dietary modifications - Recommend to trial rosuvastatin 5 mg three times weekly  - If unable to tolerate, can try ezetimibe   Follow Up Plan: 8/14  Nancy Duncan, PharmD, BCPS, CPP Clinical Pharmacist Practitioner [facility] at [facility] [phone]

## 2024-03-19 NOTE — Patient Instructions (Addendum)
 It was a pleasure speaking with you today!  Discontinue Rosuvastatin 5mg  3 times weekly  Start Ezetimibe 10mg  daily.  Continue lifestyle/dietary modifications  Feel free to call with any questions or concerns!  [name], PharmD Candidate  [employer], [name]    Darrelyn Drum, PharmD, [employer], CPP Clinical Pharmacist Practitioner [facility] at [name] [phone]

## 2024-03-19 NOTE — Progress Notes (Signed)
 03/19/2024 Name: Nancy Duncan MRN: [id number] DOB: 12-28-1940  Chief Complaint  Patient presents with   Hyperlipidemia   Medication Management    Nancy Duncan is a 83 y.o. year old female who presented for a telephone visit.   They were referred to the pharmacist by their PCP for assistance in managing hyperlipidemia.   Subjective:  Care Team: Primary Care Provider: Purcell Emil Schanz, MD ; Next Scheduled Visit: 07/23/24  Medication Access/Adherence  Current Pharmacy:  [id number] [place], [place] - [address] AT [facility] [address] & [facility] [address] [place] [address] Phone: [phone] Fax: [phone]   Patient reports affordability concerns with their medications: No  Patient reports access/transportation concerns to their pharmacy: No  Patient reports adherence concerns with their medications:  No     Hyperlipidemia/ASCVD Risk Reduction  Current lipid lowering medications: **Tried rosuvastatin 5 mg three times weekly for about 2 weeks. Stopped 5 days ago due to muscle pain.  Medications tried in the past: atorvastatin 20 mg (myalgias), rosuvastatin 10 mg (myalgias), Welchol (increased TG)  Antiplatelet regimen: aspirin 81 mg daily  ASCVD History: PAD, hx TIA  Current physical activity: has personal trainer for last 6 months, fast walking x 30 minutes days she doesn't go to the gym. Tries to exercise daily.  Diet: has been watching her diet, low sugar, low fat. Has lost 3 lbs.   Objective:  Lab Results  Component Value Date   HGBA1C 5.6 07/24/2023    Lab Results  Component Value Date   CREATININE 0.72 01/22/2024   BUN 13 01/22/2024   NA 136 01/22/2024   K 4.4 01/22/2024   CL 103 01/22/2024   CO2 26 01/22/2024    Lab Results  Component Value Date   CHOL 229 (H) 01/22/2024   HDL 68.40 01/22/2024   LDLCALC 114 (H) 01/22/2024   TRIG 233.0 (H) 01/22/2024   CHOLHDL 3 01/22/2024    Medications Reviewed  Today     Reviewed by [name], RPH (Pharmacist) on 03/19/24 at [phone]  Med List Status: <None>   Medication Order Taking? Sig Documenting Provider Last Dose Status Informant  alendronate (FOSAMAX) 70 MG tablet [id number]  TAKE 1 TABLET BY MOUTH ONCE A WEEK Purcell Emil Schanz, MD  Active   ASPIRIN 81 PO [id number] Yes Take by mouth. [provider]  Active   citalopram (CELEXA) 10 MG tablet [id number]  TAKE 1 TABLET BY MOUTH DAILY Sagardia, Emil Schanz, MD  Active   meclizine (ANTIVERT) 12.5 MG tablet [id number]  Take 1 tablet (12.5 mg total) by mouth 3 (three) times daily as needed for dizziness. Sagardia, Miguel Jose, MD  Active            Med Note CARMIN NELDA KANDICE Stevan Jan 23, 2023  9:56 AM) As needed    Discontinued 03/19/24 0928 (Side effect (s))               Assessment/Plan:   Hyperlipidemia/ASCVD Risk Reduction: - Currently uncontrolled. LDL goal <70, TG goal <150 due to hx ASCVD - Reviewed long term complications of uncontrolled cholesterol - Continue lifestyle/dietary modifications - Discontinue Rosuvastatin 5mg  3 times weekly - Start Ezetimibe 10mg  daily    Follow Up Plan: F/U with PCP in December, will f/u lab results/plan after PCP f/u  [name], PharmD Candidate  [name], [place], [name]    [name], PharmD, [employer], CPP Clinical Pharmacist Practitioner [facility] [phone]

## 2024-04-15 ENCOUNTER — Ambulatory Visit: Admitting: Neurology

## 2024-05-04 ENCOUNTER — Emergency Department (HOSPITAL_COMMUNITY)
Admission: EM | Admit: 2024-05-04 | Discharge: 2024-05-04 | Disposition: A | Discharge status: Home / Self Care | Attending: Emergency Medicine | Admitting: Emergency Medicine

## 2024-05-04 ENCOUNTER — Other Ambulatory Visit: Payer: Self-pay

## 2024-05-04 ENCOUNTER — Emergency Department (HOSPITAL_COMMUNITY)

## 2024-05-04 ENCOUNTER — Encounter (HOSPITAL_COMMUNITY): Payer: Self-pay

## 2024-05-04 DIAGNOSIS — I700 Atherosclerosis of aorta: Secondary | ICD-10-CM | POA: Diagnosis not present

## 2024-05-04 DIAGNOSIS — Z7982 Long term (current) use of aspirin: Secondary | ICD-10-CM | POA: Insufficient documentation

## 2024-05-04 DIAGNOSIS — R0789 Other chest pain: Secondary | ICD-10-CM | POA: Diagnosis not present

## 2024-05-04 DIAGNOSIS — R12 Heartburn: Secondary | ICD-10-CM | POA: Diagnosis not present

## 2024-05-04 DIAGNOSIS — I10 Essential (primary) hypertension: Secondary | ICD-10-CM | POA: Diagnosis not present

## 2024-05-04 DIAGNOSIS — Z853 Personal history of malignant neoplasm of breast: Secondary | ICD-10-CM | POA: Diagnosis not present

## 2024-05-04 DIAGNOSIS — Z79899 Other long term (current) drug therapy: Secondary | ICD-10-CM | POA: Diagnosis not present

## 2024-05-04 DIAGNOSIS — Z8673 Personal history of transient ischemic attack (TIA), and cerebral infarction without residual deficits: Secondary | ICD-10-CM | POA: Diagnosis not present

## 2024-05-04 DIAGNOSIS — R079 Chest pain, unspecified: Secondary | ICD-10-CM | POA: Diagnosis not present

## 2024-05-04 LAB — HEPATIC FUNCTION PANEL
ALT: 15 U/L (ref 0–44)
AST: 23 U/L (ref 15–41)
Albumin: 3.6 g/dL (ref 3.5–5.0)
Alkaline Phosphatase: 58 U/L (ref 38–126)
Bilirubin, Direct: 0.2 mg/dL (ref 0.0–0.2)
Indirect Bilirubin: 0.6 mg/dL (ref 0.3–0.9)
Total Bilirubin: 0.8 mg/dL (ref 0.0–1.2)
Total Protein: 6.2 g/dL — ABNORMAL LOW (ref 6.5–8.1)

## 2024-05-04 LAB — BASIC METABOLIC PANEL WITH GFR
Anion gap: 14 (ref 5–15)
BUN: 11 mg/dL (ref 8–23)
CO2: 20 mmol/L — ABNORMAL LOW (ref 22–32)
Calcium: 9 mg/dL (ref 8.9–10.3)
Chloride: 103 mmol/L (ref 98–111)
Creatinine, Ser: 0.79 mg/dL (ref 0.44–1.00)
GFR, Estimated: >60 mL/min (ref >60)
Glucose, Bld: 98 mg/dL (ref 70–99)
Potassium: 4 mmol/L (ref 3.5–5.1)
Sodium: 137 mmol/L (ref 135–145)

## 2024-05-04 LAB — CBC
HCT: 40.1 % (ref 36.0–46.0)
Hemoglobin: 13.6 g/dL (ref 12.0–15.0)
MCH: 32.7 pg (ref 26.0–34.0)
MCHC: 33.9 g/dL (ref 30.0–36.0)
MCV: 96.4 fL (ref 80.0–100.0)
Platelets: 294 K/uL (ref 150–400)
RBC: 4.16 MIL/uL (ref 3.87–5.11)
RDW: 13 % (ref 11.5–15.5)
WBC: 8.6 K/uL (ref 4.0–10.5)
nRBC: 0 % (ref 0.0–0.2)

## 2024-05-04 LAB — TROPONIN I (HIGH SENSITIVITY)
Troponin I (High Sensitivity): 4 ng/L (ref <18)
Troponin I (High Sensitivity): 4 ng/L (ref <18)

## 2024-05-04 LAB — LIPASE, BLOOD: Lipase: 18 U/L (ref 11–51)

## 2024-05-04 MED ORDER — ALUM & MAG HYDROXIDE-SIMETH 200-200-20 MG/5ML PO SUSP
30.0000 mL | Freq: Once | ORAL | Status: AC
  Administered 2024-05-04: 30 mL via ORAL
  Filled 2024-05-04: qty 30

## 2024-05-04 MED ORDER — LIDOCAINE VISCOUS HCL 2 % MT SOLN
15.0000 mL | Freq: Once | OROMUCOSAL | Status: AC
  Administered 2024-05-04: 15 mL via ORAL
  Filled 2024-05-04: qty 15

## 2024-05-04 NOTE — ED Notes (Signed)
 Patient transported to X-ray

## 2024-05-04 NOTE — ED Triage Notes (Signed)
 Pt arrives via EMS from home. Pt had a sudden onset of chest pain that radiated to her jaw that lasted about 10-15 mins. Pt states pain resolved after taking 324mg  of aspirin. Pt arrives AxOx4. She denies any complaints at this time.

## 2024-05-04 NOTE — Discharge Instructions (Signed)
 Your history, exam, workup today was overall reassuring.  Your heart enzymes were negative both times we checked them and your x-ray did not show concerning finding.  The rest of your labs were overall reassuring and your lipase for your pancreas also came back normal.  We had a shared decision making conversation about consulting cardiology today versus follow-up with your primary doctor in the next Avril days and we feel that given your stability for over 4 hours here with reassuring vital signs we feel you are safe for discharge home.  Please rest and stay hydrated and avoid any greasy or spicy foods.  If any symptoms change or worsen acutely, please turn to the nearest emergency department.

## 2024-05-04 NOTE — ED Provider Notes (Signed)
 [facility]GENC[facility] DEPARTMENT AT [facility] Provider Note   CSN: [id number] Arrival date & time: 05/04/24  8164     Patient presents with: Chest Pain   Nancy Duncan is a 83 y.o. female.   The history is provided by the patient, the EMS personnel and medical records. No language interpreter was used.  Chest Pain Pain location:  Substernal area Pain quality: aching, crushing, pressure and radiating   Pain radiates to:  R jaw and neck Pain severity:  Moderate Onset quality:  Gradual Duration:  15 minutes Timing:  Unable to specify Progression:  Resolved Chronicity:  New Relieved by:  Aspirin Worsened by:  Nothing Ineffective treatments:  None tried Associated symptoms: heartburn   Associated symptoms: no abdominal pain, no altered mental status, no back pain, no cough, no diaphoresis, no dysphagia, no fatigue, no fever, no headache, no lower extremity edema, no nausea, no numbness, no palpitations, no shortness of breath, no syncope, no vomiting and no weakness        Prior to Admission medications   Medication Sig Start Date End Date Taking? Authorizing Provider  alendronate  (FOSAMAX ) 70 MG tablet TAKE 1 TABLET BY MOUTH ONCE A WEEK 11/24/23   Purcell Emil Schanz, MD  ASPIRIN 81 PO Take by mouth.    [provider]  citalopram  (CELEXA ) 10 MG tablet TAKE 1 TABLET BY MOUTH DAILY 01/12/24   Purcell Emil Schanz, MD  ezetimibe  (ZETIA ) 10 MG tablet Take 1 tablet (10 mg total) by mouth daily. 03/19/24   Purcell Emil Schanz, MD  meclizine  (ANTIVERT ) 12.5 MG tablet Take 1 tablet (12.5 mg total) by mouth 3 (three) times daily as needed for dizziness. 08/14/22   Sagardia, Miguel Jose, MD    Allergies: Erythromycin, Lamotrigine , Lyrica  [pregabalin ], and Statins    Review of Systems  Constitutional:  Negative for diaphoresis, fatigue and fever.  HENT:  Negative for congestion and trouble swallowing.   Eyes:  Negative for visual disturbance.  Respiratory:   Negative for cough, chest tightness and shortness of breath.   Cardiovascular:  Positive for chest pain. Negative for palpitations and syncope.  Gastrointestinal:  Positive for heartburn. Negative for abdominal pain, constipation, diarrhea, nausea and vomiting.  Genitourinary:  Negative for dysuria.  Musculoskeletal:  Negative for back pain, neck pain and neck stiffness.  Skin:  Negative for rash and wound.  Neurological:  Negative for weakness, light-headedness, numbness and headaches.  Psychiatric/Behavioral:  Negative for agitation and confusion.   All other systems reviewed and are negative.   Updated Vital Signs BP (!) 185/76   Pulse 81   Temp 97.9 F (36.6 C) (Oral)   Resp 17   SpO2 100%   Physical Exam Vitals and nursing note reviewed.  Constitutional:      General: She is not in acute distress.    Appearance: She is well-developed. She is not ill-appearing, toxic-appearing or diaphoretic.  HENT:     Head: Normocephalic and atraumatic.  Eyes:     Conjunctiva/sclera: Conjunctivae normal.  Cardiovascular:     Rate and Rhythm: Normal rate and regular rhythm.     Heart sounds: Normal heart sounds. No murmur heard. Pulmonary:     Effort: Pulmonary effort is normal. No respiratory distress.     Breath sounds: Normal breath sounds. No wheezing, rhonchi or rales.  Chest:     Chest wall: No tenderness.  Abdominal:     Palpations: Abdomen is soft.     Tenderness: There is no abdominal tenderness.  Musculoskeletal:        General: No swelling.     Cervical back: Neck supple.     Right lower leg: No edema.     Left lower leg: No edema.  Skin:    General: Skin is warm and dry.     Capillary Refill: Capillary refill takes less than 2 seconds.     Findings: No erythema.  Neurological:     General: No focal deficit present.     Mental Status: She is alert.  Psychiatric:        Mood and Affect: Mood normal.     (all labs ordered are listed, but only abnormal results are  displayed) Labs Reviewed  BASIC METABOLIC PANEL WITH GFR - Abnormal; Notable for the following components:      Result Value   CO2 20 (*)    All other components within normal limits  HEPATIC FUNCTION PANEL - Abnormal; Notable for the following components:   Total Protein 6.2 (*)    All other components within normal limits  CBC  LIPASE, BLOOD  TROPONIN I (HIGH SENSITIVITY)  TROPONIN I (HIGH SENSITIVITY)    EKG: EKG Interpretation Date/Time:  Monday May 04 2024 18:44:54 EDT Ventricular Rate:  75 PR Interval:  214 QRS Duration:  97 QT Interval:  391 QTC Calculation: 437 R Axis:   29  Text Interpretation: Sinus rhythm Borderline prolonged PR interval when compared to prior, similar appearance NO STEMI Confirmed by Ginger Barefoot ([id number]) on 05/04/2024 6:49:36 PM  Radiology: [facility] Chest 2 View Result Date: 05/04/2024 CLINICAL DATA:  Chest pain. EXAM: DG CHEST 2V COMPARISON:  None Available. FINDINGS: The cardiomediastinal contours are normal. Aortic atherosclerosis. Air-filled esophagus pulmonary vasculature is normal. No consolidation, pleural effusion, or pneumothorax. No acute osseous abnormalities are seen. Left chest wall/breast surgical clips. IMPRESSION: 1. No acute chest findings. 2. Aortic atherosclerosis. Electronically Signed   By: Andrea Gasman M.D.   On: 05/04/2024 19:15     Procedures   Medications Ordered in the ED  alum & mag hydroxide-simeth (MAALOX/MYLANTA) 200-200-20 MG/5ML suspension 30 mL (30 mLs Oral Given 05/04/24 1856)    And  lidocaine (XYLOCAINE) 2 % viscous mouth solution 15 mL (15 mLs Oral Given 05/04/24 1856)                                    Medical Decision Making Amount and/or Complexity of Data Reviewed Labs: ordered. Radiology: ordered.  Risk OTC drugs. Prescription drug management.    Nancy Duncan is a 83 y.o. female with a past medical history significant for TIA, previous breast cancer, previous pancreatic tumor, osteoporosis,  vertigo, and previous splenectomy who presents for chest pain.  According to patient, before arrival, patient was at home and started having onset of central chest pain and pressure that radiated up into her right jaw.  She has never had pain like this before.  She has a history of reflux and does report that she had a jalapeno with a cracker earlier today and said the pain initially felt somewhat like reflux but that escalated and moved to her jaw.  She reports she was not diaphoretic and did not have shortness of breath and she did not think it was pleuritic or exertional.  She reports she was walking around the house at the time.  Denies any recent fevers, chills, congestion, cough, nausea, vomiting, constipation, diarrhea, or urinary changes.  Denies  any leg pain or leg swelling.  Denies recent travel or recent trauma.  She reports at its worst it was moderate in severity it is about a 5 out of 10 and then after she took 324 of aspirin it resolved.  Otherwise she denies abdominal pain, flank pain or back pain.  On exam, lungs were clear.  Chest nontender.  Abdomen nontender.  I did not appreciate a murmur.  She has intact pulses in extremities and legs are nontender and nonedematous.  She is resting comfortably and happily without any problem at this time.  Blood pressure is slightly elevated in the 180s but patient does state she gets some whitecoat hypertension and thinks that could be driving it up.  EKG does not show STEMI.  Clinically I am somewhat concerned about this 83 year old with central chest pain that went to her jaw earlier today.  We will get a cardiac workup, x-ray, and labs.  As it was not pleuritic, she denies shortness of breath or diaphoresis and did not have tachycardia tachypnea or respiratory distress with hypoxia, have less suspicion for thromboembolic disease.  Will get her workup and give her a GI cocktail given the recent jalapeno and her report that it felt like heartburn  initially.  Anticipate reassessment after workup to determine disposition.    8:41 PM Workup continues to return reassuring.  Initial troponin is negative, will trend.  Hepatic function reassuring and kidney function reassuring.  CBC reassuring.  Chest x-ray did not show concerning finding and patient is feeling well now.  Lipase is collected and still in process.  Patient says she would likely want to go home without calling cardiology if her second opponent is negative and she still feels well.  This is reasonable.  Anticipate reassessment after second troponin and lipase return.  Anticipate discharge.  Second opponent and lipase normal.  Patient will follow-up with her outpatient team.  She did not want us  to call cardiology and would rather go home.  Patient agrees with plan of care and patient discharged in good condition.     Final diagnoses:  Atypical chest pain    ED Discharge Orders     None      Clinical Impression: 1. Atypical chest pain     Disposition: Discharge  Condition: Good  I have discussed the results, Dx and Tx plan with the pt(& family if present). He/she/they expressed understanding and agree(s) with the plan. Discharge instructions discussed at great length. Strict return precautions discussed and pt &/or family have verbalized understanding of the instructions. No further questions at time of discharge.    Discharge Medication List as of 05/04/2024 11:07 PM      Follow Up: Purcell Emil Schanz, MD [address] [place] [address] [phone]     [facility] Emergency Department at [facility] [address] [place]  [address] [phone]        Lannah Koike, Lonni PARAS, MD 05/04/24 [phone]

## 2024-05-07 ENCOUNTER — Encounter: Payer: Self-pay | Admitting: Emergency Medicine

## 2024-05-07 ENCOUNTER — Ambulatory Visit: Admitting: Emergency Medicine

## 2024-05-07 ENCOUNTER — Ambulatory Visit: Payer: Self-pay | Admitting: Emergency Medicine

## 2024-05-07 VITALS — BP 124/74 | HR 79 | Temp 98.2°F | Ht 65.0 in | Wt 178.0 lb

## 2024-05-07 DIAGNOSIS — E785 Hyperlipidemia, unspecified: Secondary | ICD-10-CM

## 2024-05-07 DIAGNOSIS — F5104 Psychophysiologic insomnia: Secondary | ICD-10-CM

## 2024-05-07 LAB — COMPREHENSIVE METABOLIC PANEL WITH GFR
ALT: 18 U/L (ref 0–35)
AST: 22 U/L (ref 0–37)
Albumin: 4.4 g/dL (ref 3.5–5.2)
Alkaline Phosphatase: 66 U/L (ref 39–117)
BUN: 12 mg/dL (ref 6–23)
CO2: 23 meq/L (ref 19–32)
Calcium: 9.4 mg/dL (ref 8.4–10.5)
Chloride: 104 meq/L (ref 96–112)
Creatinine, Ser: 0.79 mg/dL (ref 0.40–1.20)
GFR: 68.39 mL/min (ref >60.00)
Glucose, Bld: 92 mg/dL (ref 70–99)
Potassium: 4.1 meq/L (ref 3.5–5.1)
Sodium: 137 meq/L (ref 135–145)
Total Bilirubin: 0.4 mg/dL (ref 0.2–1.2)
Total Protein: 7.2 g/dL (ref 6.0–8.3)

## 2024-05-07 LAB — LIPID PANEL
Cholesterol: 225 mg/dL — ABNORMAL HIGH (ref 0–200)
HDL: 67.3 mg/dL (ref >39.00)
LDL Cholesterol: 118 mg/dL — ABNORMAL HIGH (ref 0–99)
NonHDL: 158.19
Total CHOL/HDL Ratio: 3
Triglycerides: 202 mg/dL — ABNORMAL HIGH (ref 0.0–149.0)
VLDL: 40.4 mg/dL — ABNORMAL HIGH (ref 0.0–40.0)

## 2024-05-07 MED ORDER — HYDROXYZINE HCL 25 MG PO TABS: 12.5000 mg | ORAL_TABLET | Freq: Every evening | ORAL | 3 refills | Status: DC | PRN

## 2024-05-07 NOTE — Assessment & Plan Note (Signed)
 Hydroxyzine  12.5 mg at bedtime helping a great deal Recommend to continue at bedtime as needed Much improved

## 2024-05-07 NOTE — Assessment & Plan Note (Signed)
 History of statin side effects in the past.  Reacted to atorvastatin . Recently prescribed WelChol  but has not started Recently prescribed Zetia  but not taking it States all cholesterol medications gave her muscle soreness History of peripheral artery disease and TIA in the past Not taking any medication at present time. Diet and nutrition discussed

## 2024-05-07 NOTE — Patient Instructions (Signed)
 Health Maintenance After Age 85 After age 27, you are at a higher risk for certain long-term diseases and infections as well as injuries from falls. Falls are a major cause of broken bones and head injuries in people who are older than age 73. Getting regular preventive care can help to keep you healthy and well. Preventive care includes getting regular testing and making lifestyle changes as recommended by your health care provider. Talk with your health care provider about: Which screenings and tests you should have. A screening is a test that checks for a disease when you have no symptoms. A diet and exercise plan that is right for you. What should I know about screenings and tests to prevent falls? Screening and testing are the best ways to find a health problem early. Early diagnosis and treatment give you the best chance of managing medical conditions that are common after age 90. Certain conditions and lifestyle choices may make you more likely to have a fall. Your health care provider may recommend: Regular vision checks. Poor vision and conditions such as cataracts can make you more likely to have a fall. If you wear glasses, make sure to get your prescription updated if your vision changes. Medicine review. Work with your health care provider to regularly review all of the medicines you are taking, including over-the-counter medicines. Ask your health care provider about any side effects that may make you more likely to have a fall. Tell your health care provider if any medicines that you take make you feel dizzy or sleepy. Strength and balance checks. Your health care provider may recommend certain tests to check your strength and balance while standing, walking, or changing positions. Foot health exam. Foot pain and numbness, as well as not wearing proper footwear, can make you more likely to have a fall. Screenings, including: Osteoporosis screening. Osteoporosis is a condition that causes  the bones to get weaker and break more easily. Blood pressure screening. Blood pressure changes and medicines to control blood pressure can make you feel dizzy. Depression screening. You may be more likely to have a fall if you have a fear of falling, feel depressed, or feel unable to do activities that you used to do. Alcohol  use screening. Using too much alcohol  can affect your balance and may make you more likely to have a fall. Follow these instructions at home: Lifestyle Do not drink alcohol  if: Your health care provider tells you not to drink. If you drink alcohol : Limit how much you have to: 0-1 drink a day for women. 0-2 drinks a day for men. Know how much alcohol  is in your drink. In the [place] one drink equals one 12 oz bottle of beer (355 mL), one 5 oz glass of wine (148 mL), or one 1 oz glass of hard liquor (44 mL). Do not use any products that contain nicotine or tobacco. These products include cigarettes, chewing tobacco, and vaping devices, such as e-cigarettes. If you need help quitting, ask your health care provider. Activity  Follow a regular exercise program to stay fit. This will help you maintain your balance. Ask your health care provider what types of exercise are appropriate for you. If you need a cane or walker, use it as recommended by your health care provider. Wear supportive shoes that have nonskid soles. Safety  Remove any tripping hazards, such as rugs, cords, and clutter. Install safety equipment such as grab bars in bathrooms and safety rails on stairs. Keep rooms and walkways  well-lit. General instructions Talk with your health care provider about your risks for falling. Tell your health care provider if: You fall. Be sure to tell your health care provider about all falls, even ones that seem minor. You feel dizzy, tiredness (fatigue), or off-balance. Take over-the-counter and prescription medicines only as told by your health care provider. These include  supplements. Eat a healthy diet and maintain a healthy weight. A healthy diet includes low-fat dairy products, low-fat (lean) meats, and fiber from whole grains, beans, and lots of fruits and vegetables. Stay current with your vaccines. Schedule regular health, dental, and eye exams. Summary Having a healthy lifestyle and getting preventive care can help to protect your health and wellness after age 15. Screening and testing are the best way to find a health problem early and help you avoid having a fall. Early diagnosis and treatment give you the best chance for managing medical conditions that are more common for people who are older than age 42. Falls are a major cause of broken bones and head injuries in people who are older than age 64. Take precautions to prevent a fall at home. Work with your health care provider to learn what changes you can make to improve your health and wellness and to prevent falls. This information is not intended to replace advice given to you by your health care provider. Make sure you discuss any questions you have with your health care provider. Document Revised: 12/12/2020 Document Reviewed: 12/12/2020 Elsevier Patient Education  2024 [employer].

## 2024-05-07 NOTE — Progress Notes (Signed)
 Nancy Duncan 83 y.o.   Chief Complaint  Patient presents with   Shortness of Breath    Patient here for a catch in throat. Has been going on for about a month. She also states wanting to go over cholesterol     HISTORY OF PRESENT ILLNESS: This is a 83 y.o. female complaining of a catch in the throat for the past 2 months progressively getting better Denies difficulty breathing or difficulty swallowing No other associated symptoms Also has history of dyslipidemia.  Has questions about medications.  Cholesterol medications give her muscle soreness.  Does not want to take any. History of chronic insomnia.  Hydroxyzine  12.5 mg at bedtime helps a great deal.  Requesting refill. No other complaints or medical concerns today.  Shortness of Breath Pertinent negatives include no abdominal pain, chest pain, fever, headaches, rash, sore throat or vomiting.     Prior to Admission medications   Medication Sig Start Date End Date Taking? Authorizing Provider  alendronate  (FOSAMAX ) 70 MG tablet TAKE 1 TABLET BY MOUTH ONCE A WEEK 11/24/23  Yes Julianne Chamberlin Jose, MD  ASPIRIN 81 PO Take by mouth.   Yes [provider]  citalopram  (CELEXA ) 10 MG tablet TAKE 1 TABLET BY MOUTH DAILY 01/12/24  Yes Mirca Yale, Emil Schanz, MD  meclizine  (ANTIVERT ) 12.5 MG tablet Take 1 tablet (12.5 mg total) by mouth 3 (three) times daily as needed for dizziness. 08/14/22  Yes Ashlin Hidalgo, Emil Schanz, MD  ezetimibe  (ZETIA ) 10 MG tablet Take 1 tablet (10 mg total) by mouth daily. 03/19/24   Purcell Emil Schanz, MD    Allergies  Allergen Reactions   Erythromycin Swelling   Lamotrigine      Muscle soreness    Lyrica  [Pregabalin ]     Muscle soreness   Statins Other (See Comments)    Sore muscles. 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   Ventral hernia without obstruction or gangrene 08/02/2021   TIA (transient ischemic attack) 06/15/2021    Past Medical History:  Diagnosis Date   Breast cancer (HCC)    Cancer (HCC)    Breast Cancer   Pancreatic tumor    Personal history of radiation therapy     Past Surgical History:  Procedure Laterality Date   APPENDECTOMY     BREAST LUMPECTOMY     SPLENECTOMY, TOTAL      Social History   Socioeconomic History   Marital status: Widowed    Spouse name: Not on file   Number of children: 3   Years of education: Not on file   Highest education level: Master's degree (e.g., MA, MS, MEng, MEd, MSW, MBA)  Occupational History   Occupation: RETIRED  Tobacco Use   Smoking status: Former    Types: Cigarettes   Smokeless tobacco: Never  Vaping Use   Vaping status: Never Used  Substance and Sexual Activity   Alcohol use: Yes    Comment: rare   Drug use: Never   Sexual activity: Not on file  Other Topics Concern   Not on file  Social History Narrative   Right handed    Caffeine  cup per day   Lives at home alone /2025   Social Drivers of Health   Financial Resource Strain: Low Risk  (02/18/2024)   Overall Financial Resource Strain (CARDIA)    Difficulty of Paying Living Expenses: Not hard at all  Food Insecurity: No Food Insecurity (02/18/2024)   Hunger Vital Sign    Worried About Running Out of Food in the Last Year: Never true    Ran Out of Food in the Last Year: Never true  Transportation Needs: No Transportation Needs (02/18/2024)   PRAPARE - Administrator, Civil Service (Medical): No    Lack of Transportation (Non-Medical): No  Physical  Activity: Sufficiently Active (02/18/2024)   Exercise Vital Sign    Days of Exercise per Week: 6 days    Minutes of Exercise per Session: 30 min  Stress: No Stress Concern Present (02/18/2024)   [employer] of Occupational Health - Occupational Stress Questionnaire    Feeling of Stress: Not at all  Social Connections: Moderately Isolated (02/18/2024)   Social Connection and Isolation Panel    Frequency of Communication with Friends and Family: Twice a week    Frequency of Social Gatherings with Friends and Family: More than three times a week    Attends Religious Services: Never    Database administrator or Organizations: Yes    Attends Banker Meetings: Never    Marital Status: Widowed  Intimate Partner Violence: Patient Unable To Answer (02/18/2024)   Humiliation, Afraid, Rape, and Kick questionnaire    Fear of Current or Ex-Partner: Patient unable to answer    Emotionally Abused: Patient unable to answer    Physically Abused: Patient unable to answer    Sexually Abused: Patient unable to answer    Family History  Problem Relation Age of Onset   Cancer - Ovarian Mother    Arthritis Father    Alzheimer's disease Sister    Arthritis Sister      Review of Systems  Constitutional: Negative.  Negative for chills and fever.  HENT: Negative.  Negative for congestion and sore throat.   Respiratory:  Positive for shortness of breath.   Cardiovascular: Negative.  Negative for chest pain and palpitations.  Gastrointestinal:  Negative for abdominal pain, diarrhea, nausea and vomiting.  Genitourinary: Negative.  Negative for dysuria and hematuria.  Skin: Negative.  Negative for rash.  Neurological:  Negative for dizziness and headaches.  Psychiatric/Behavioral:  The patient has insomnia.     Vitals:   05/07/24 1255  BP: 124/74  Pulse: 79  Temp: 98.2 F (36.8 C)  SpO2: 95%    Physical Exam Vitals reviewed.  Constitutional:      Appearance: Normal appearance.   HENT:     Mouth/Throat:     Mouth: Mucous membranes are moist.     Pharynx: Oropharynx is clear.  Eyes:     Extraocular Movements: Extraocular movements intact.  Cardiovascular:     Rate and Rhythm: Normal rate.  Pulmonary:     Effort: Pulmonary effort is normal.  Musculoskeletal:     Cervical back: No tenderness.  Lymphadenopathy:     Cervical: No cervical adenopathy.  Skin:    General: Skin is warm and dry.  Neurological:     Mental Status: She is alert and oriented to person, place, and time.  Psychiatric:        Mood and Affect: Mood normal.  Behavior: Behavior normal.      ASSESSMENT & PLAN: Problem List Items Addressed This Visit       Other   Dyslipidemia - Primary   History of statin side effects in the past.  Reacted to atorvastatin . Recently prescribed WelChol  but has not started Recently prescribed Zetia  but not taking it States all cholesterol medications gave her muscle soreness History of peripheral artery disease and TIA in the past Not taking any medication at present time. Diet and nutrition discussed      Relevant Orders   Comprehensive metabolic panel with GFR   Lipid panel   Chronic insomnia   Hydroxyzine  12.5 mg at bedtime helping a great deal Recommend to continue at bedtime as needed Much improved      Relevant Medications   hydrOXYzine  (ATARAX ) 25 MG tablet   Patient Instructions  Health Maintenance After Age 27 After age 61, you are at a higher risk for certain long-term diseases and infections as well as injuries from falls. Falls are a major cause of broken bones and head injuries in people who are older than age 37. Getting regular preventive care can help to keep you healthy and well. Preventive care includes getting regular testing and making lifestyle changes as recommended by your health care provider. Talk with your health care provider about: Which screenings and tests you should have. A screening is a test that checks  for a disease when you have no symptoms. A diet and exercise plan that is right for you. What should I know about screenings and tests to prevent falls? Screening and testing are the best ways to find a health problem early. Early diagnosis and treatment give you the best chance of managing medical conditions that are common after age 75. Certain conditions and lifestyle choices may make you more likely to have a fall. Your health care provider may recommend: Regular vision checks. Poor vision and conditions such as cataracts can make you more likely to have a fall. If you wear glasses, make sure to get your prescription updated if your vision changes. Medicine review. Work with your health care provider to regularly review all of the medicines you are taking, including over-the-counter medicines. Ask your health care provider about any side effects that may make you more likely to have a fall. Tell your health care provider if any medicines that you take make you feel dizzy or sleepy. Strength and balance checks. Your health care provider may recommend certain tests to check your strength and balance while standing, walking, or changing positions. Foot health exam. Foot pain and numbness, as well as not wearing proper footwear, can make you more likely to have a fall. Screenings, including: Osteoporosis screening. Osteoporosis is a condition that causes the bones to get weaker and break more easily. Blood pressure screening. Blood pressure changes and medicines to control blood pressure can make you feel dizzy. Depression screening. You may be more likely to have a fall if you have a fear of falling, feel depressed, or feel unable to do activities that you used to do. Alcohol use screening. Using too much alcohol can affect your balance and may make you more likely to have a fall. Follow these instructions at home: Lifestyle Do not drink alcohol if: Your health care provider tells you not to  drink. If you drink alcohol: Limit how much you have to: 0-1 drink a day for women. 0-2 drinks a day for men. Know how much alcohol is in your  drink. In the [place] one drink equals one 12 oz bottle of beer (355 mL), one 5 oz glass of wine (148 mL), or one 1 oz glass of hard liquor (44 mL). Do not use any products that contain nicotine or tobacco. These products include cigarettes, chewing tobacco, and vaping devices, such as e-cigarettes. If you need help quitting, ask your health care provider. Activity  Follow a regular exercise program to stay fit. This will help you maintain your balance. Ask your health care provider what types of exercise are appropriate for you. If you need a cane or walker, use it as recommended by your health care provider. Wear supportive shoes that have nonskid soles. Safety  Remove any tripping hazards, such as rugs, cords, and clutter. Install safety equipment such as grab bars in bathrooms and safety rails on stairs. Keep rooms and walkways well-lit. General instructions Talk with your health care provider about your risks for falling. Tell your health care provider if: You fall. Be sure to tell your health care provider about all falls, even ones that seem minor. You feel dizzy, tiredness (fatigue), or off-balance. Take over-the-counter and prescription medicines only as told by your health care provider. These include supplements. Eat a healthy diet and maintain a healthy weight. A healthy diet includes low-fat dairy products, low-fat (lean) meats, and fiber from whole grains, beans, and lots of fruits and vegetables. Stay current with your vaccines. Schedule regular health, dental, and eye exams. Summary Having a healthy lifestyle and getting preventive care can help to protect your health and wellness after age 8. Screening and testing are the best way to find a health problem early and help you avoid having a fall. Early diagnosis and treatment give you  the best chance for managing medical conditions that are more common for people who are older than age 47. Falls are a major cause of broken bones and head injuries in people who are older than age 78. Take precautions to prevent a fall at home. Work with your health care provider to learn what changes you can make to improve your health and wellness and to prevent falls. This information is not intended to replace advice given to you by your health care provider. Make sure you discuss any questions you have with your health care provider. Document Revised: 12/12/2020 Document Reviewed: 12/12/2020 Elsevier Patient Education  2024 Elsevier Inc.    Emil Schaumann, MD  Primary Care at [facility]

## 2024-05-21 ENCOUNTER — Other Ambulatory Visit: Payer: Medicare HMO

## 2024-06-23 ENCOUNTER — Ambulatory Visit (HOSPITAL_BASED_OUTPATIENT_CLINIC_OR_DEPARTMENT_OTHER)
Admission: RE | Admit: 2024-06-23 | Discharge: 2024-06-23 | Disposition: A | Discharge status: Home / Self Care | Source: Ambulatory Visit | Attending: Internal Medicine | Admitting: Internal Medicine

## 2024-06-23 DIAGNOSIS — M810 Age-related osteoporosis without current pathological fracture: Secondary | ICD-10-CM | POA: Diagnosis not present

## 2024-06-23 DIAGNOSIS — Z780 Asymptomatic menopausal state: Secondary | ICD-10-CM | POA: Diagnosis not present

## 2024-06-23 DIAGNOSIS — Z13820 Encounter for screening for osteoporosis: Secondary | ICD-10-CM | POA: Diagnosis not present

## 2024-06-24 ENCOUNTER — Ambulatory Visit: Payer: Self-pay | Admitting: Emergency Medicine

## 2024-06-24 DIAGNOSIS — Z8739 Personal history of other diseases of the musculoskeletal system and connective tissue: Secondary | ICD-10-CM

## 2024-06-29 ENCOUNTER — Encounter: Payer: Self-pay | Admitting: Physician Assistant

## 2024-06-29 ENCOUNTER — Ambulatory Visit: Admitting: Physician Assistant

## 2024-06-29 VITALS — Ht 65.0 in | Wt 176.6 lb

## 2024-06-29 DIAGNOSIS — M810 Age-related osteoporosis without current pathological fracture: Secondary | ICD-10-CM | POA: Diagnosis not present

## 2024-06-29 NOTE — Progress Notes (Signed)
 Office Visit Note   Patient: Nancy Duncan           Date of Birth: March 30, 1941           MRN: [id number] Visit Date: 06/29/2024              Requested by: Purcell Emil Schanz, MD [address] [place],  [place] [address] PCP: Purcell Emil Schanz, MD   Assessment & Plan: Visit Diagnoses:  1. Age-related osteoporosis without current pathological fracture     Plan: [name] is a pleasant 83 year old woman who comes in today in referral from Dr. Purcell.  She has been taking Fosamax  for quite a long time.  She is going to discontinue it because she is feeling side effects including vertigo and she is concerned because she tends to have bad reactions to lots of medications.  She had a right arm fracture 10 to 15 years ago which was secondary to a trauma.  She does have have a history of TIA.  She also has a history of breast cancer no history of kidney disease ulcers gastric bypass or reflux.  No history of seizures.  She thinks she went through menopause when she was about 38 did not do hormone replacement she does take calcium  and vitamin D  but is unsure of how much she is taking.  She is a former smoker she is very good about exercising going to PT doing resistance training twice a week.  And is walking on alternate days.  4000 steps.  She has had no problem with dental issues and no history of parent with spine or hip fracture.  She would like to manage her osteopenia by definition on her most recent bone density scan with the exception of her forearm which is clearly osteoporotic.  She does not want to do injectables at all.  She does not want to take Fosamax .  She is willing to track her calcium  and vitamin D  and adjust these as needed.  We will draw vitamin D  today.  If she changed her mind about treatment I would recommend Prolia.  She may follow-up with me as needed.  45 minutes was spent reviewing her chart and talking to her about different options  Follow-Up Instructions: Return if  symptoms worsen or fail to improve.   Orders:  Orders Placed This Encounter  Procedures   Vitamin D  (25 hydroxy)   No orders of the defined types were placed in this encounter.     Procedures: No procedures performed   Clinical Data: No additional findings.   Subjective: Chief Complaint  Patient presents with   Osteoporosis    HPI patient is a pleasant 83 year old woman who is referred by Dr. Sagardia for evaluation of osteoporosis she is currently on Fosamax   Review of Systems  All other systems reviewed and are negative.    Objective: Vital Signs: Ht 5' 5 (1.651 m)   Wt 176 lb 9.6 oz (80.1 kg)   BMI 29.39 kg/m   Physical Exam Constitutional:      Appearance: Normal appearance.  Pulmonary:     Effort: Pulmonary effort is normal.  Skin:    General: Skin is warm and dry.  Neurological:     Mental Status: She is alert.  Psychiatric:        Mood and Affect: Mood normal.        Behavior: Behavior normal.       Specialty Comments:  No specialty comments available.  Imaging: No results  found.   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   Ventral hernia without obstruction or gangrene 08/02/2021   TIA (transient ischemic attack) 06/15/2021   Past Medical History:  Diagnosis Date   Breast cancer (HCC)    Cancer (HCC)    Breast Cancer   Pancreatic tumor    Personal history of radiation therapy     Family  History  Problem Relation Age of Onset   Cancer - Ovarian Mother    Arthritis Father    Alzheimer's disease Sister    Arthritis Sister     Past Surgical History:  Procedure Laterality Date   APPENDECTOMY     BREAST LUMPECTOMY     SPLENECTOMY, TOTAL     Social History   Occupational History   Occupation: RETIRED  Tobacco Use   Smoking status: Former    Types: Cigarettes   Smokeless tobacco: Never  Vaping Use   Vaping status: Never Used  Substance and Sexual Activity   Alcohol use: Yes    Comment: rare   Drug use: Never   Sexual activity: Not on file

## 2024-06-30 LAB — VITAMIN D 25 HYDROXY (VIT D DEFICIENCY, FRACTURES): Vit D, 25-Hydroxy: 45 ng/mL (ref 30–100)

## 2024-07-01 DIAGNOSIS — H6121 Impacted cerumen, right ear: Secondary | ICD-10-CM | POA: Diagnosis not present

## 2024-07-14 DIAGNOSIS — D225 Melanocytic nevi of trunk: Secondary | ICD-10-CM | POA: Diagnosis not present

## 2024-07-14 DIAGNOSIS — Z1283 Encounter for screening for malignant neoplasm of skin: Secondary | ICD-10-CM | POA: Diagnosis not present

## 2024-07-23 ENCOUNTER — Ambulatory Visit: Admitting: Emergency Medicine

## 2024-07-23 ENCOUNTER — Encounter: Payer: Self-pay | Admitting: Emergency Medicine

## 2024-07-23 VITALS — BP 122/80 | HR 93 | Temp 97.5°F | Ht 65.0 in | Wt 177.0 lb

## 2024-07-23 DIAGNOSIS — E785 Hyperlipidemia, unspecified: Secondary | ICD-10-CM

## 2024-07-23 DIAGNOSIS — H6121 Impacted cerumen, right ear: Secondary | ICD-10-CM | POA: Diagnosis not present

## 2024-07-23 DIAGNOSIS — M810 Age-related osteoporosis without current pathological fracture: Secondary | ICD-10-CM | POA: Diagnosis not present

## 2024-07-23 DIAGNOSIS — I739 Peripheral vascular disease, unspecified: Secondary | ICD-10-CM | POA: Diagnosis not present

## 2024-07-23 NOTE — Assessment & Plan Note (Signed)
 Right ear irrigated with good notes. Post procedure examination: Normal tympanic membrane No complications.  Tolerated procedure well.

## 2024-07-23 NOTE — Assessment & Plan Note (Signed)
 History of statin side effects in the past.  Reacted to atorvastatin . Recently prescribed WelChol  but has not started Recently prescribed Zetia  but not taking it States all cholesterol medications gave her muscle soreness History of peripheral artery disease and TIA in the past Not taking any medication at present time. Diet and nutrition discussed

## 2024-07-23 NOTE — Patient Instructions (Signed)
 Health Maintenance After Age 85 After age 27, you are at a higher risk for certain long-term diseases and infections as well as injuries from falls. Falls are a major cause of broken bones and head injuries in people who are older than age 73. Getting regular preventive care can help to keep you healthy and well. Preventive care includes getting regular testing and making lifestyle changes as recommended by your health care provider. Talk with your health care provider about: Which screenings and tests you should have. A screening is a test that checks for a disease when you have no symptoms. A diet and exercise plan that is right for you. What should I know about screenings and tests to prevent falls? Screening and testing are the best ways to find a health problem early. Early diagnosis and treatment give you the best chance of managing medical conditions that are common after age 90. Certain conditions and lifestyle choices may make you more likely to have a fall. Your health care provider may recommend: Regular vision checks. Poor vision and conditions such as cataracts can make you more likely to have a fall. If you wear glasses, make sure to get your prescription updated if your vision changes. Medicine review. Work with your health care provider to regularly review all of the medicines you are taking, including over-the-counter medicines. Ask your health care provider about any side effects that may make you more likely to have a fall. Tell your health care provider if any medicines that you take make you feel dizzy or sleepy. Strength and balance checks. Your health care provider may recommend certain tests to check your strength and balance while standing, walking, or changing positions. Foot health exam. Foot pain and numbness, as well as not wearing proper footwear, can make you more likely to have a fall. Screenings, including: Osteoporosis screening. Osteoporosis is a condition that causes  the bones to get weaker and break more easily. Blood pressure screening. Blood pressure changes and medicines to control blood pressure can make you feel dizzy. Depression screening. You may be more likely to have a fall if you have a fear of falling, feel depressed, or feel unable to do activities that you used to do. Alcohol  use screening. Using too much alcohol  can affect your balance and may make you more likely to have a fall. Follow these instructions at home: Lifestyle Do not drink alcohol  if: Your health care provider tells you not to drink. If you drink alcohol : Limit how much you have to: 0-1 drink a day for women. 0-2 drinks a day for men. Know how much alcohol  is in your drink. In the [place] one drink equals one 12 oz bottle of beer (355 mL), one 5 oz glass of wine (148 mL), or one 1 oz glass of hard liquor (44 mL). Do not use any products that contain nicotine or tobacco. These products include cigarettes, chewing tobacco, and vaping devices, such as e-cigarettes. If you need help quitting, ask your health care provider. Activity  Follow a regular exercise program to stay fit. This will help you maintain your balance. Ask your health care provider what types of exercise are appropriate for you. If you need a cane or walker, use it as recommended by your health care provider. Wear supportive shoes that have nonskid soles. Safety  Remove any tripping hazards, such as rugs, cords, and clutter. Install safety equipment such as grab bars in bathrooms and safety rails on stairs. Keep rooms and walkways  well-lit. General instructions Talk with your health care provider about your risks for falling. Tell your health care provider if: You fall. Be sure to tell your health care provider about all falls, even ones that seem minor. You feel dizzy, tiredness (fatigue), or off-balance. Take over-the-counter and prescription medicines only as told by your health care provider. These include  supplements. Eat a healthy diet and maintain a healthy weight. A healthy diet includes low-fat dairy products, low-fat (lean) meats, and fiber from whole grains, beans, and lots of fruits and vegetables. Stay current with your vaccines. Schedule regular health, dental, and eye exams. Summary Having a healthy lifestyle and getting preventive care can help to protect your health and wellness after age 15. Screening and testing are the best way to find a health problem early and help you avoid having a fall. Early diagnosis and treatment give you the best chance for managing medical conditions that are more common for people who are older than age 42. Falls are a major cause of broken bones and head injuries in people who are older than age 64. Take precautions to prevent a fall at home. Work with your health care provider to learn what changes you can make to improve your health and wellness and to prevent falls. This information is not intended to replace advice given to you by your health care provider. Make sure you discuss any questions you have with your health care provider. Document Revised: 12/12/2020 Document Reviewed: 12/12/2020 Elsevier Patient Education  2024 [employer].

## 2024-07-23 NOTE — Assessment & Plan Note (Signed)
Clinically stable. Continues daily baby aspirin No signs of severe peripheral circulatory insufficiency

## 2024-07-23 NOTE — Assessment & Plan Note (Signed)
 States she has osteopenia Not taking Fosamax  anymore No more side effects

## 2024-07-23 NOTE — Progress Notes (Signed)
 Nancy Duncan 83 y.o.   Chief Complaint  Patient presents with   Follow-up    Pt would need her right ear looked at     HISTORY OF PRESENT ILLNESS: This is a 83 y.o. female here for 12-month follow-up of chronic medical conditions Also complaining of earwax impaction right ear No other complaints or medical concerns today.  HPI   Prior to Admission medications  Medication Sig Start Date End Date Taking? Authorizing Provider  ASPIRIN 81 PO Take by mouth.   Yes [provider]  citalopram  (CELEXA ) 10 MG tablet TAKE 1 TABLET BY MOUTH DAILY 01/12/24  Yes Zavia Pullen, Emil Schanz, MD  hydrOXYzine  (ATARAX ) 25 MG tablet Take 0.5 tablets (12.5 mg total) by mouth at bedtime as needed. 05/07/24  Yes Alaisa Moffitt, Emil Schanz, MD  meclizine  (ANTIVERT ) 12.5 MG tablet Take 1 tablet (12.5 mg total) by mouth 3 (three) times daily as needed for dizziness. 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   Peripheral artery disease 07/25/2022   History of osteoporosis 01/15/2022   Posterior interosseous mononeuropathy, left 08/16/2021   Bilateral carpal tunnel syndrome 08/16/2021   History of TIA (transient ischemic attack) 08/02/2021   Pancreatic tumor 08/02/2021   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   Ventral hernia without obstruction or gangrene 08/02/2021   TIA (transient ischemic attack) 06/15/2021    Past Medical History:  Diagnosis Date   Breast cancer (HCC)    Cancer (HCC)    Breast Cancer   Pancreatic tumor    Personal history of radiation therapy     Past  Surgical History:  Procedure Laterality Date   APPENDECTOMY     BREAST LUMPECTOMY     SPLENECTOMY, TOTAL      Social History   Socioeconomic History   Marital status: Widowed    Spouse name: Not on file   Number of children: 3   Years of education: Not on file   Highest education level: Master's degree (e.g., MA, MS, MEng, MEd, MSW, MBA)  Occupational History   Occupation: RETIRED  Tobacco Use   Smoking status: Former    Types: Cigarettes   Smokeless tobacco: Never  Vaping Use   Vaping status: Never Used  Substance and Sexual Activity   Alcohol use: Yes    Comment: rare   Drug use: Never   Sexual activity: Not on file  Other Topics Concern   Not on file  Social History Narrative   Right handed    Caffeine  cup per day   Lives at home alone /2025   Social Drivers of Health   Tobacco Use: Low Risk  (07/01/2024)   Received from [facility]   Patient History    Smoking Tobacco Use: Never    Smokeless Tobacco Use: Never    Passive Exposure: Not on file  Recent Concern: Tobacco Use - Medium Risk (06/29/2024)   Patient History    Smoking Tobacco Use: Former    Smokeless Tobacco Use: Never    Passive Exposure: Not on file  Financial Resource Strain: Low Risk (07/19/2024)   Overall Financial  Resource Strain (CARDIA)    Difficulty of Paying Living Expenses: Not hard at all  Food Insecurity: No Food Insecurity (07/19/2024)   Epic    Worried About Programme Researcher, Broadcasting/film/video in the Last Year: Never true    Ran Out of Food in the Last Year: Never true  Transportation Needs: No Transportation Needs (07/19/2024)   Epic    Lack of Transportation (Medical): No    Lack of Transportation (Non-Medical): No  Physical Activity: Sufficiently Active (07/19/2024)   Exercise Vital Sign    Days of Exercise per Week: 6 days    Minutes of Exercise per Session: 30 min  Stress: No Stress Concern Present (07/19/2024)   [employer] of Occupational Health - Occupational Stress Questionnaire     Feeling of Stress: Only a little  Social Connections: Moderately Isolated (07/19/2024)   Social Connection and Isolation Panel    Frequency of Communication with Friends and Family: More than three times a week    Frequency of Social Gatherings with Friends and Family: More than three times a week    Attends Religious Services: Never    Database Administrator or Organizations: Yes    Attends Engineer, Structural: More than 4 times per year    Marital Status: Widowed  Intimate Partner Violence: Patient Unable To Answer (02/18/2024)   Epic    Fear of Current or Ex-Partner: Patient unable to answer    Emotionally Abused: Patient unable to answer    Physically Abused: Patient unable to answer    Sexually Abused: Patient unable to answer  Depression (PHQ2-9): Low Risk (05/07/2024)   Depression (PHQ2-9)    PHQ-2 Score: 0  Alcohol Screen: Low Risk (07/19/2024)   Alcohol Screen    Last Alcohol Screening Score (AUDIT): 2  Housing: Low Risk (07/19/2024)   Epic    Unable to Pay for Housing in the Last Year: No    Number of Times Moved in the Last Year: 1    Homeless in the Last Year: No  Utilities: Not At Risk (02/18/2024)   Epic    Threatened with loss of utilities: No  Health Literacy: Adequate Health Literacy (02/18/2024)   B1300 Health Literacy    Frequency of need for help with medical instructions: Never    Family History  Problem Relation Age of Onset   Cancer - Ovarian Mother    Arthritis Father    Alzheimer's disease Sister    Arthritis Sister      Review of Systems  Constitutional: Negative.  Negative for chills and fever.  HENT:  Positive for hearing loss.   Respiratory: Negative.  Negative for cough and shortness of breath.   Cardiovascular: Negative.  Negative for chest pain and palpitations.  Gastrointestinal:  Negative for abdominal pain, diarrhea, nausea and vomiting.  Genitourinary: Negative.  Negative for dysuria and hematuria.  Skin: Negative.   Negative for rash.  Neurological: Negative.  Negative for dizziness and headaches.  All other systems reviewed and are negative.   Vitals:   07/23/24 1353  BP: 122/80  Pulse: 93  Temp: (!) 97.5 F (36.4 C)  SpO2: 97%    Physical Exam Vitals reviewed.  Constitutional:      Appearance: Normal appearance.  HENT:     Head: Normocephalic.     Right Ear: There is impacted cerumen.     Left Ear: Tympanic membrane, ear canal and external ear normal.     Mouth/Throat:     Mouth: Mucous membranes  are moist.     Pharynx: Oropharynx is clear.  Eyes:     Extraocular Movements: Extraocular movements intact.     Conjunctiva/sclera: Conjunctivae normal.     Pupils: Pupils are equal, round, and reactive to light.  Cardiovascular:     Rate and Rhythm: Normal rate and regular rhythm.     Pulses: Normal pulses.     Heart sounds: Normal heart sounds.  Pulmonary:     Effort: Pulmonary effort is normal.     Breath sounds: Normal breath sounds.  Musculoskeletal:     Cervical back: No tenderness.  Lymphadenopathy:     Cervical: No cervical adenopathy.  Skin:    General: Skin is warm and dry.     Capillary Refill: Capillary refill takes less than 2 seconds.  Neurological:     General: No focal deficit present.     Mental Status: She is alert and oriented to person, place, and time.  Psychiatric:        Mood and Affect: Mood normal.        Behavior: Behavior normal.    Procedure note: After obtaining verbal consent from patient right ear was gently irrigated with saline solution with good results.  Tolerated procedure well.  No complications.  Postprocedure examination within normal limits.  ASSESSMENT & PLAN: A total of 40 minutes was spent with the patient and counseling/coordination of care regarding preparing for this visit, review of most recent office visit notes, review of multiple chronic medical conditions and their management, review of all medications, review of most recent  bloodwork results, review of health maintenance items, education on nutrition, prognosis, documentation, and need for follow up.   Problem List Items Addressed This Visit       Cardiovascular and Mediastinum   Peripheral artery disease - Primary   Clinically stable. Continues daily baby aspirin No signs of severe peripheral circulatory insufficiency        Nervous and Auditory   Impacted cerumen of right ear   Right ear irrigated with good notes. Post procedure examination: Normal tympanic membrane No complications.  Tolerated procedure well.        Musculoskeletal and Integument   Age-related osteoporosis without current pathological fracture   States she has osteopenia Not taking Fosamax  anymore No more side effects        Other   Dyslipidemia   History of statin side effects in the past.  Reacted to atorvastatin . Recently prescribed WelChol  but has not started Recently prescribed Zetia  but not taking it States all cholesterol medications gave her muscle soreness History of peripheral artery disease and TIA in the past Not taking any medication at present time. Diet and nutrition discussed      Patient Instructions  Health Maintenance After Age 18 After age 53, you are at a higher risk for certain long-term diseases and infections as well as injuries from falls. Falls are a major cause of broken bones and head injuries in people who are older than age 49. Getting regular preventive care can help to keep you healthy and well. Preventive care includes getting regular testing and making lifestyle changes as recommended by your health care provider. Talk with your health care provider about: Which screenings and tests you should have. A screening is a test that checks for a disease when you have no symptoms. A diet and exercise plan that is right for you. What should I know about screenings and tests to prevent falls? Screening and testing are the best  ways to find a  health problem early. Early diagnosis and treatment give you the best chance of managing medical conditions that are common after age 51. Certain conditions and lifestyle choices may make you more likely to have a fall. Your health care provider may recommend: Regular vision checks. Poor vision and conditions such as cataracts can make you more likely to have a fall. If you wear glasses, make sure to get your prescription updated if your vision changes. Medicine review. Work with your health care provider to regularly review all of the medicines you are taking, including over-the-counter medicines. Ask your health care provider about any side effects that may make you more likely to have a fall. Tell your health care provider if any medicines that you take make you feel dizzy or sleepy. Strength and balance checks. Your health care provider may recommend certain tests to check your strength and balance while standing, walking, or changing positions. Foot health exam. Foot pain and numbness, as well as not wearing proper footwear, can make you more likely to have a fall. Screenings, including: Osteoporosis screening. Osteoporosis is a condition that causes the bones to get weaker and break more easily. Blood pressure screening. Blood pressure changes and medicines to control blood pressure can make you feel dizzy. Depression screening. You may be more likely to have a fall if you have a fear of falling, feel depressed, or feel unable to do activities that you used to do. Alcohol use screening. Using too much alcohol can affect your balance and may make you more likely to have a fall. Follow these instructions at home: Lifestyle Do not drink alcohol if: Your health care provider tells you not to drink. If you drink alcohol: Limit how much you have to: 0-1 drink a day for women. 0-2 drinks a day for men. Know how much alcohol is in your drink. In the [place] one drink equals one 12 oz bottle of beer  (355 mL), one 5 oz glass of wine (148 mL), or one 1 oz glass of hard liquor (44 mL). Do not use any products that contain nicotine or tobacco. These products include cigarettes, chewing tobacco, and vaping devices, such as e-cigarettes. If you need help quitting, ask your health care provider. Activity  Follow a regular exercise program to stay fit. This will help you maintain your balance. Ask your health care provider what types of exercise are appropriate for you. If you need a cane or walker, use it as recommended by your health care provider. Wear supportive shoes that have nonskid soles. Safety  Remove any tripping hazards, such as rugs, cords, and clutter. Install safety equipment such as grab bars in bathrooms and safety rails on stairs. Keep rooms and walkways well-lit. General instructions Talk with your health care provider about your risks for falling. Tell your health care provider if: You fall. Be sure to tell your health care provider about all falls, even ones that seem minor. You feel dizzy, tiredness (fatigue), or off-balance. Take over-the-counter and prescription medicines only as told by your health care provider. These include supplements. Eat a healthy diet and maintain a healthy weight. A healthy diet includes low-fat dairy products, low-fat (lean) meats, and fiber from whole grains, beans, and lots of fruits and vegetables. Stay current with your vaccines. Schedule regular health, dental, and eye exams. Summary Having a healthy lifestyle and getting preventive care can help to protect your health and wellness after age 15. Screening and testing are the  best way to find a health problem early and help you avoid having a fall. Early diagnosis and treatment give you the best chance for managing medical conditions that are more common for people who are older than age 54. Falls are a major cause of broken bones and head injuries in people who are older than age 24. Take  precautions to prevent a fall at home. Work with your health care provider to learn what changes you can make to improve your health and wellness and to prevent falls. This information is not intended to replace advice given to you by your health care provider. Make sure you discuss any questions you have with your health care provider. Document Revised: 12/12/2020 Document Reviewed: 12/12/2020 Elsevier Patient Education  2024 [employer]    Emil Schaumann, MD  [facility]     [1]  Allergies Allergen Reactions   Erythromycin Swelling   Lamotrigine      Muscle soreness    Lyrica  [Pregabalin ]     Muscle soreness   Statins Other (See Comments)    Sore muscles. Unable to tolerate rosuvastatin  5 mg three times weekly

## 2024-08-25 ENCOUNTER — Encounter: Payer: Self-pay | Admitting: Emergency Medicine

## 2024-08-25 ENCOUNTER — Ambulatory Visit (INDEPENDENT_AMBULATORY_CARE_PROVIDER_SITE_OTHER): Admitting: Emergency Medicine

## 2024-08-25 VITALS — BP 118/82 | HR 80 | Temp 97.9°F | Ht 65.0 in | Wt 174.0 lb

## 2024-08-25 DIAGNOSIS — I739 Peripheral vascular disease, unspecified: Secondary | ICD-10-CM

## 2024-08-25 DIAGNOSIS — E785 Hyperlipidemia, unspecified: Secondary | ICD-10-CM

## 2024-08-25 DIAGNOSIS — M810 Age-related osteoporosis without current pathological fracture: Secondary | ICD-10-CM | POA: Diagnosis not present

## 2024-08-25 DIAGNOSIS — Z8673 Personal history of transient ischemic attack (TIA), and cerebral infarction without residual deficits: Secondary | ICD-10-CM

## 2024-08-25 NOTE — Assessment & Plan Note (Signed)
 States she has osteopenia Not taking Fosamax  anymore No more side effects

## 2024-08-25 NOTE — Progress Notes (Signed)
 Nancy Duncan 84 y.o.   Chief Complaint  Patient presents with   Follow-up    Sore muscles, pt states that any medication she takes makes her muscle hurt     HISTORY OF PRESENT ILLNESS: This is a 84 y.o. female here for follow-up of chronic medical conditions Asymptomatic today but it seems like any type of medication makes her muscles sore including low-dose aspirin Eating well.  Exercises regularly.  Very sensitive to medications so she decided not to take any. Has no other complaints or medical concerns today.  HPI   Prior to Admission medications  Medication Sig Start Date End Date Taking? Authorizing Provider  ASPIRIN 81 PO Take by mouth.    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   Peripheral artery disease 07/25/2022   Impacted cerumen of right ear 07/25/2022   History of osteoporosis 01/15/2022   Posterior interosseous mononeuropathy, left 08/16/2021   Bilateral carpal tunnel syndrome 08/16/2021   History of TIA (transient ischemic attack) 08/02/2021   Pancreatic tumor 08/02/2021   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   Ventral hernia without obstruction or gangrene 08/02/2021    Past Medical History:  Diagnosis Date   Breast cancer (HCC)    Cancer (HCC)    Breast Cancer   Pancreatic tumor    Personal history of radiation therapy     Past Surgical History:  Procedure Laterality Date   APPENDECTOMY     BREAST LUMPECTOMY     SPLENECTOMY, TOTAL      Social History   Socioeconomic History   Marital status: Widowed    Spouse name: Not on file   Number of children: 3   Years of education: Not on file   Highest education level: Master's degree (e.g., MA, MS, MEng, MEd, MSW, MBA)  Occupational History   Occupation:  RETIRED  Tobacco Use   Smoking status: Former    Types: Cigarettes   Smokeless tobacco: Never  Vaping Use   Vaping status: Never Used  Substance and Sexual Activity   Alcohol use: Yes    Comment: rare   Drug use: Never   Sexual activity: Not on file  Other Topics Concern   Not on file  Social History Narrative   Right handed    Caffeine  cup per day   Lives at home alone /2025   Social Drivers of Health   Tobacco Use: Medium Risk (07/23/2024)   Patient History    Smoking Tobacco Use: Former    Smokeless Tobacco Use: Never    Passive Exposure: Not on Actuary Strain: Low Risk (07/19/2024)   Overall Financial Resource Strain (CARDIA)    Difficulty of Paying Living Expenses: Not hard at all  Food Insecurity: No Food Insecurity (07/19/2024)   Epic    Worried About Radiation Protection Practitioner of Food in the Last Year: Never true    Ran Out of Food in the Last Year: Never true  Transportation Needs: No Transportation Needs (07/19/2024)   Epic    Lack of Transportation (Medical): No    Lack of Transportation (Non-Medical): No  Physical Activity: Sufficiently Active (07/19/2024)   Exercise Vital Sign    Days of Exercise per Week: 6 days    Minutes of Exercise per Session: 30 min  Stress: No  Stress Concern Present (07/19/2024)   [employer] of Occupational Health - Occupational Stress Questionnaire    Feeling of Stress: Only a little  Social Connections: Moderately Isolated (07/19/2024)   Social Connection and Isolation Panel    Frequency of Communication with Friends and Family: More than three times a week    Frequency of Social Gatherings with Friends and Family: More than three times a week    Attends Religious Services: Never    Database Administrator or Organizations: Yes    Attends Engineer, Structural: More than 4 times per year    Marital Status: Widowed  Intimate Partner Violence: Patient Unable To Answer (02/18/2024)   Epic    Fear of Current or  Ex-Partner: Patient unable to answer    Emotionally Abused: Patient unable to answer    Physically Abused: Patient unable to answer    Sexually Abused: Patient unable to answer  Depression (PHQ2-9): Low Risk (05/07/2024)   Depression (PHQ2-9)    PHQ-2 Score: 0  Alcohol Screen: Low Risk (07/19/2024)   Alcohol Screen    Last Alcohol Screening Score (AUDIT): 2  Housing: Low Risk (07/19/2024)   Epic    Unable to Pay for Housing in the Last Year: No    Number of Times Moved in the Last Year: 1    Homeless in the Last Year: No  Utilities: Not At Risk (02/18/2024)   Epic    Threatened with loss of utilities: No  Health Literacy: Adequate Health Literacy (02/18/2024)   B1300 Health Literacy    Frequency of need for help with medical instructions: Never    Family History  Problem Relation Age of Onset   Cancer - Ovarian Mother    Arthritis Father    Alzheimer's disease Sister    Arthritis Sister      Review of Systems  Constitutional: Negative.  Negative for chills and fever.  HENT: Negative.  Negative for congestion and sore throat.   Respiratory: Negative.  Negative for cough and shortness of breath.   Cardiovascular: Negative.  Negative for chest pain and palpitations.  Gastrointestinal:  Negative for abdominal pain, diarrhea, nausea and vomiting.  Genitourinary: Negative.  Negative for dysuria and hematuria.  Skin: Negative.  Negative for rash.  Neurological: Negative.  Negative for dizziness and headaches.  All other systems reviewed and are negative.   Vitals:   08/25/24 1429  BP: 118/82  Pulse: 80  Temp: 97.9 F (36.6 C)  SpO2: 98%    Physical Exam Vitals reviewed.  Constitutional:      Appearance: Normal appearance.  HENT:     Head: Normocephalic.  Eyes:     Extraocular Movements: Extraocular movements intact.  Cardiovascular:     Rate and Rhythm: Normal rate.  Pulmonary:     Effort: Pulmonary effort is normal.  Skin:    General: Skin is warm and dry.   Neurological:     Mental Status: She is alert and oriented to person, place, and time.  Psychiatric:        Mood and Affect: Mood normal.        Behavior: Behavior normal.      ASSESSMENT & PLAN: Problem List Items Addressed This Visit       Cardiovascular and Mediastinum   Peripheral artery disease   Clinically stable. 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  Was advised against surgery.      Dyslipidemia - Primary   Sensitive to all cholesterol medications Prefers to continue with diet and nutrition Exercises regularly Does not want to take any medications      Patient Instructions  Health Maintenance After Age 68 After age 41, you are at a higher risk for certain long-term diseases and infections as well as injuries from falls. Falls are a major cause of broken bones and head injuries in people who are older than age 3. Getting regular preventive care can help to keep you healthy and well. Preventive care includes getting regular testing and making lifestyle changes as recommended by your health care provider. Talk with your health care provider about: Which screenings and tests you should have. A screening is a test that checks for a disease when you have no symptoms. A diet and exercise plan that is right for you. What should I know about screenings and tests to prevent falls? Screening and testing are the best ways to find a health problem early. Early diagnosis and treatment give you the best chance of managing medical conditions that are common after age 73. Certain conditions and  lifestyle choices may make you more likely to have a fall. Your health care provider may recommend: Regular vision checks. Poor vision and conditions such as cataracts can make you more likely to have a fall. If you wear glasses, make sure to get your prescription updated if your vision changes. Medicine review. Work with your health care provider to regularly review all of the medicines you are taking, including over-the-counter medicines. Ask your health care provider about any side effects that may make you more likely to have a fall. Tell your health care provider if any medicines that you take make you feel dizzy or sleepy. Strength and balance checks. Your health care provider may recommend certain tests to check your strength and balance while standing, walking, or changing positions. Foot health exam. Foot pain and numbness, as well as not wearing proper footwear, can make you more likely to have a fall. Screenings, including: Osteoporosis screening. Osteoporosis is a condition that causes the bones to get weaker and break more easily. Blood pressure screening. Blood pressure changes and medicines to control blood pressure can make you feel dizzy. Depression screening. You may be more likely to have a fall if you have a fear of falling, feel depressed, or feel unable to do activities that you used to do. Alcohol use screening. Using too much alcohol can affect your balance and may make you more likely to have a fall. Follow these instructions at home: Lifestyle Do not drink alcohol if: Your health care provider tells you not to drink. If you drink alcohol: Limit how much you have to: 0-1 drink a day for women. 0-2 drinks a day for men. Know how much alcohol is in your drink. In the [place] one drink equals one 12 oz bottle of beer (355 mL), one 5 oz glass of wine (148 mL), or one 1 oz glass of hard liquor (44 mL). Do not use any products that contain nicotine or tobacco. These products  include cigarettes, chewing tobacco, and vaping devices, such as e-cigarettes. If you  need help quitting, ask your health care provider. Activity  Follow a regular exercise program to stay fit. This will help you maintain your balance. Ask your health care provider what types of exercise are appropriate for you. If you need a cane or walker, use it as recommended by your health care provider. Wear supportive shoes that have nonskid soles. Safety  Remove any tripping hazards, such as rugs, cords, and clutter. Install safety equipment such as grab bars in bathrooms and safety rails on stairs. Keep rooms and walkways well-lit. General instructions Talk with your health care provider about your risks for falling. Tell your health care provider if: You fall. Be sure to tell your health care provider about all falls, even ones that seem minor. You feel dizzy, tiredness (fatigue), or off-balance. Take over-the-counter and prescription medicines only as told by your health care provider. These include supplements. Eat a healthy diet and maintain a healthy weight. A healthy diet includes low-fat dairy products, low-fat (lean) meats, and fiber from whole grains, beans, and lots of fruits and vegetables. Stay current with your vaccines. Schedule regular health, dental, and eye exams. Summary Having a healthy lifestyle and getting preventive care can help to protect your health and wellness after age 72. Screening and testing are the best way to find a health problem early and help you avoid having a fall. Early diagnosis and treatment give you the best chance for managing medical conditions that are more common for people who are older than age 18. Falls are a major cause of broken bones and head injuries in people who are older than age 31. Take precautions to prevent a fall at home. Work with your health care provider to learn what changes you can make to improve your health and wellness and to prevent  falls. This information is not intended to replace advice given to you by your health care provider. Make sure you discuss any questions you have with your health care provider. Document Revised: 12/12/2020 Document Reviewed: 12/12/2020 Elsevier Patient Education  2024 [employer] Inc.    Emil Schaumann, MD [facility]     [1]  Allergies Allergen Reactions   Erythromycin Swelling   Lamotrigine      Muscle soreness    Lyrica  [Pregabalin ]     Muscle soreness   Statins Other (See Comments)    Sore muscles. Unable to tolerate rosuvastatin  5 mg three times weekly

## 2024-08-25 NOTE — Assessment & Plan Note (Signed)
 Sensitive to all cholesterol medications Prefers to continue with diet and nutrition Exercises regularly Does not want to take any medications

## 2024-08-25 NOTE — Assessment & Plan Note (Signed)
 Clinically stable. Not taking baby aspirin No signs of severe peripheral circulatory insufficiency

## 2024-08-25 NOTE — Assessment & Plan Note (Signed)
 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  Was advised against surgery.

## 2024-08-25 NOTE — Patient Instructions (Signed)
 Health Maintenance After Age 86 After age 27, you are at a higher risk for certain long-term diseases and infections as well as injuries from falls. Falls are a major cause of broken bones and head injuries in people who are older than age 73. Getting regular preventive care can help to keep you healthy and well. Preventive care includes getting regular testing and making lifestyle changes as recommended by your health care provider. Talk with your health care provider about: Which screenings and tests you should have. A screening is a test that checks for a disease when you have no symptoms. A diet and exercise plan that is right for you. What should I know about screenings and tests to prevent falls? Screening and testing are the best ways to find a health problem early. Early diagnosis and treatment give you the best chance of managing medical conditions that are common after age 90. Certain conditions and lifestyle choices may make you more likely to have a fall. Your health care provider may recommend: Regular vision checks. Poor vision and conditions such as cataracts can make you more likely to have a fall. If you wear glasses, make sure to get your prescription updated if your vision changes. Medicine review. Work with your health care provider to regularly review all of the medicines you are taking, including over-the-counter medicines. Ask your health care provider about any side effects that may make you more likely to have a fall. Tell your health care provider if any medicines that you take make you feel dizzy or sleepy. Strength and balance checks. Your health care provider may recommend certain tests to check your strength and balance while standing, walking, or changing positions. Foot health exam. Foot pain and numbness, as well as not wearing proper footwear, can make you more likely to have a fall. Screenings, including: Osteoporosis screening. Osteoporosis is a condition that causes  the bones to get weaker and break more easily. Blood pressure screening. Blood pressure changes and medicines to control blood pressure can make you feel dizzy. Depression screening. You may be more likely to have a fall if you have a fear of falling, feel depressed, or feel unable to do activities that you used to do. Alcohol  use screening. Using too much alcohol  can affect your balance and may make you more likely to have a fall. Follow these instructions at home: Lifestyle Do not drink alcohol  if: Your health care provider tells you not to drink. If you drink alcohol : Limit how much you have to: 0-1 drink a day for women. 0-2 drinks a day for men. Know how much alcohol  is in your drink. In the [place] one drink equals one 12 oz bottle of beer (355 mL), one 5 oz glass of wine (148 mL), or one 1 oz glass of hard liquor (44 mL). Do not use any products that contain nicotine or tobacco. These products include cigarettes, chewing tobacco, and vaping devices, such as e-cigarettes. If you need help quitting, ask your health care provider. Activity  Follow a regular exercise program to stay fit. This will help you maintain your balance. Ask your health care provider what types of exercise are appropriate for you. If you need a cane or walker, use it as recommended by your health care provider. Wear supportive shoes that have nonskid soles. Safety  Remove any tripping hazards, such as rugs, cords, and clutter. Install safety equipment such as grab bars in bathrooms and safety rails on stairs. Keep rooms and walkways  well-lit. General instructions Talk with your health care provider about your risks for falling. Tell your health care provider if: You fall. Be sure to tell your health care provider about all falls, even ones that seem minor. You feel dizzy, tiredness (fatigue), or off-balance. Take over-the-counter and prescription medicines only as told by your health care provider. These include  supplements. Eat a healthy diet and maintain a healthy weight. A healthy diet includes low-fat dairy products, low-fat (lean) meats, and fiber from whole grains, beans, and lots of fruits and vegetables. Stay current with your vaccines. Schedule regular health, dental, and eye exams. Summary Having a healthy lifestyle and getting preventive care can help to protect your health and wellness after age 15. Screening and testing are the best way to find a health problem early and help you avoid having a fall. Early diagnosis and treatment give you the best chance for managing medical conditions that are more common for people who are older than age 42. Falls are a major cause of broken bones and head injuries in people who are older than age 64. Take precautions to prevent a fall at home. Work with your health care provider to learn what changes you can make to improve your health and wellness and to prevent falls. This information is not intended to replace advice given to you by your health care provider. Make sure you discuss any questions you have with your health care provider. Document Revised: 12/12/2020 Document Reviewed: 12/12/2020 Elsevier Patient Education  2024 [employer].

## 2024-08-26 ENCOUNTER — Ambulatory Visit: Admitting: Emergency Medicine

## 2025-02-01 ENCOUNTER — Ambulatory Visit: Admitting: Neurology

## 2025-02-18 ENCOUNTER — Ambulatory Visit
